# Patient Record
Sex: Male | Born: 1965 | Race: White | Hispanic: No | Marital: Single | Smoking: Never smoker
Health system: Southern US, Community
[De-identification: ages and names within clinical notes are randomized; demographics above are authoritative.]

## PROBLEM LIST (undated history)

## (undated) DIAGNOSIS — I10 Essential (primary) hypertension: Secondary | ICD-10-CM

## (undated) DIAGNOSIS — N400 Enlarged prostate without lower urinary tract symptoms: Secondary | ICD-10-CM

## (undated) DIAGNOSIS — N200 Calculus of kidney: Secondary | ICD-10-CM

## (undated) DIAGNOSIS — F329 Major depressive disorder, single episode, unspecified: Secondary | ICD-10-CM

## (undated) DIAGNOSIS — F32A Depression, unspecified: Secondary | ICD-10-CM

## (undated) DIAGNOSIS — I219 Acute myocardial infarction, unspecified: Secondary | ICD-10-CM

## (undated) HISTORY — DX: Benign prostatic hyperplasia without lower urinary tract symptoms: N40.0

## (undated) HISTORY — PX: OTHER SURGICAL HISTORY: SHX169

## (undated) HISTORY — PX: CHOLECYSTECTOMY: SHX55

## (undated) HISTORY — PX: HAND SURGERY: SHX662

## (undated) HISTORY — DX: Acute myocardial infarction, unspecified: I21.9

## (undated) HISTORY — DX: Calculus of kidney: N20.0

## (undated) HISTORY — PX: FRACTURE SURGERY: SHX138

## (undated) HISTORY — PX: KIDNEY SURGERY: SHX687

---

## 1996-02-09 HISTORY — PX: KIDNEY SURGERY: SHX687

## 1997-09-15 ENCOUNTER — Inpatient Hospital Stay (HOSPITAL_COMMUNITY): Admission: EM | Admit: 1997-09-15 | Discharge: 1997-09-16 | Payer: Self-pay | Admitting: Emergency Medicine

## 1997-09-26 ENCOUNTER — Emergency Department (HOSPITAL_COMMUNITY): Admission: EM | Admit: 1997-09-26 | Discharge: 1997-09-26 | Payer: Self-pay | Admitting: Emergency Medicine

## 1999-05-14 ENCOUNTER — Emergency Department (HOSPITAL_COMMUNITY): Admission: EM | Admit: 1999-05-14 | Discharge: 1999-05-14 | Payer: Self-pay | Admitting: Emergency Medicine

## 2001-06-28 ENCOUNTER — Emergency Department (HOSPITAL_COMMUNITY): Admission: EM | Admit: 2001-06-28 | Discharge: 2001-06-28 | Payer: Self-pay | Admitting: *Deleted

## 2001-06-28 ENCOUNTER — Encounter: Payer: Self-pay | Admitting: *Deleted

## 2001-07-16 ENCOUNTER — Encounter: Payer: Self-pay | Admitting: Emergency Medicine

## 2001-07-16 ENCOUNTER — Emergency Department (HOSPITAL_COMMUNITY): Admission: EM | Admit: 2001-07-16 | Discharge: 2001-07-16 | Payer: Self-pay | Admitting: Emergency Medicine

## 2001-08-30 ENCOUNTER — Emergency Department (HOSPITAL_COMMUNITY): Admission: EM | Admit: 2001-08-30 | Discharge: 2001-08-30 | Payer: Self-pay | Admitting: Emergency Medicine

## 2001-09-11 ENCOUNTER — Emergency Department (HOSPITAL_COMMUNITY): Admission: EM | Admit: 2001-09-11 | Discharge: 2001-09-11 | Payer: Self-pay | Admitting: Emergency Medicine

## 2001-09-11 ENCOUNTER — Encounter: Payer: Self-pay | Admitting: Emergency Medicine

## 2001-09-14 ENCOUNTER — Encounter: Payer: Self-pay | Admitting: Emergency Medicine

## 2001-09-14 ENCOUNTER — Emergency Department (HOSPITAL_COMMUNITY): Admission: EM | Admit: 2001-09-14 | Discharge: 2001-09-14 | Payer: Self-pay

## 2001-09-18 ENCOUNTER — Emergency Department (HOSPITAL_COMMUNITY): Admission: EM | Admit: 2001-09-18 | Discharge: 2001-09-18 | Payer: Self-pay

## 2002-04-19 ENCOUNTER — Emergency Department (HOSPITAL_COMMUNITY): Admission: EM | Admit: 2002-04-19 | Discharge: 2002-04-19 | Payer: Self-pay | Admitting: Emergency Medicine

## 2002-04-19 ENCOUNTER — Encounter: Payer: Self-pay | Admitting: Emergency Medicine

## 2002-04-21 ENCOUNTER — Encounter: Payer: Self-pay | Admitting: Emergency Medicine

## 2002-04-21 ENCOUNTER — Inpatient Hospital Stay (HOSPITAL_COMMUNITY): Admission: EM | Admit: 2002-04-21 | Discharge: 2002-04-24 | Payer: Self-pay | Admitting: Emergency Medicine

## 2002-04-22 ENCOUNTER — Encounter: Payer: Self-pay | Admitting: Urology

## 2002-04-23 ENCOUNTER — Ambulatory Visit (HOSPITAL_BASED_OUTPATIENT_CLINIC_OR_DEPARTMENT_OTHER): Admission: RE | Admit: 2002-04-23 | Discharge: 2002-04-23 | Payer: Self-pay | Admitting: Urology

## 2002-04-23 ENCOUNTER — Encounter: Payer: Self-pay | Admitting: Urology

## 2002-04-24 ENCOUNTER — Encounter: Payer: Self-pay | Admitting: Urology

## 2002-05-02 ENCOUNTER — Ambulatory Visit (HOSPITAL_COMMUNITY): Admission: RE | Admit: 2002-05-02 | Discharge: 2002-05-02 | Payer: Self-pay | Admitting: Urology

## 2003-01-04 ENCOUNTER — Emergency Department (HOSPITAL_COMMUNITY): Admission: EM | Admit: 2003-01-04 | Discharge: 2003-01-04 | Payer: Self-pay | Admitting: Emergency Medicine

## 2003-02-19 ENCOUNTER — Inpatient Hospital Stay (HOSPITAL_COMMUNITY): Admission: EM | Admit: 2003-02-19 | Discharge: 2003-02-25 | Payer: Self-pay | Admitting: Emergency Medicine

## 2003-11-11 ENCOUNTER — Emergency Department (HOSPITAL_COMMUNITY): Admission: EM | Admit: 2003-11-11 | Discharge: 2003-11-11 | Payer: Self-pay | Admitting: Emergency Medicine

## 2004-03-17 ENCOUNTER — Emergency Department (HOSPITAL_COMMUNITY): Admission: EM | Admit: 2004-03-17 | Discharge: 2004-03-17 | Payer: Self-pay | Admitting: *Deleted

## 2004-03-17 ENCOUNTER — Emergency Department (HOSPITAL_COMMUNITY): Admission: EM | Admit: 2004-03-17 | Discharge: 2004-03-17 | Payer: Self-pay | Admitting: Emergency Medicine

## 2006-03-16 ENCOUNTER — Encounter: Admission: RE | Admit: 2006-03-16 | Discharge: 2006-03-16 | Payer: Self-pay | Admitting: Orthopedic Surgery

## 2006-06-18 ENCOUNTER — Emergency Department (HOSPITAL_COMMUNITY): Admission: EM | Admit: 2006-06-18 | Discharge: 2006-06-18 | Payer: Self-pay | Admitting: Emergency Medicine

## 2007-05-26 ENCOUNTER — Emergency Department (HOSPITAL_COMMUNITY): Admission: EM | Admit: 2007-05-26 | Discharge: 2007-05-26 | Payer: Self-pay | Admitting: Emergency Medicine

## 2010-06-26 NOTE — Discharge Summary (Signed)
Leon Pena, Leon Pena                          ACCOUNT NO.:  [id number]   MEDICAL RECORD NO.:  [id number]                   PATIENT TYPE:  INP   LOCATION:  5707                                 FACILITY:  [facility]   PHYSICIAN:  Corinna L. Lendell Caprice, MD             DATE OF BIRTH:  [birth year]   DATE OF ADMISSION:  02/19/2003  DATE OF DISCHARGE:  02/25/2003                                 DISCHARGE SUMMARY   DIAGNOSES:  1. Pyelonephritis.  2. Dehydration.  3. Intractable nausea and vomiting.  4. History of right nephrectomy.  5. History of closed head injury.  6. Tobacco abuse.   DISCHARGE MEDICATIONS:  1. Ampicillin 500 mg p.o. q.6h. for seven more days.  2. Compazine 10 mg p.o. as needed for nausea.  3. Percocet 1-2 p.o. q.4h. p.r.n. pain, 10 more dispensed.   Follow up with [facility] as needed.   ACTIVITY:  Ad lib.   HISTORY/HOSPITAL COURSE:  Mr. Sedivy is a 45 year old white male who  presented to the emergency room with vomiting and flank pain for about a  week.  He has a history of pyelonephritis in the past and has had a right  nephrectomy for unknown reason.  He was also having fever and diarrhea.  He  was unable to keep anything down.  He was found to have a urinary tract  infection and left CVA tenderness.   His urine culture grew out Enterococcus, which was sensitive to ampicillin,  levofloxacin, nitrofurantoin, and vancomycin.  His UA showed moderate  leukocyte esterase, negative nitrites, many bacteria, 11-20 white cells.  LFTs were normal.  Basic metabolic panel normal.  Initial white blood cell  count was 11,000.   CAT scan of the abdomen and pelvis without IV or oral contrast showed no  evidence of ureteral calculi and was otherwise stable.   Patient was admitted to the floor, given IV fluids, IV antiemetics, and IV  antibiotics.  He continued to have pain and intractable vomiting for several  days but eventually was able to tolerate a regular diet.  As the  Enterococcus was sensitive to ampicillin, he was switched over and is being  discharged on ampicillin to complete a total of two weeks.  He has no primary care physician, and it sounds as though he was having  urinary frequency and other symptoms for about a week.  So I have  recommended that he establish care with a primary care physician of his  choice or [facility] so that hospitalization can be prevented,  hopefully, if this recurs.                                                Corinna L. Lendell Caprice, MD    CLS/MEDQ  D:  02/25/2003  T:  02/26/2003  Job:  [id number]

## 2010-06-26 NOTE — Op Note (Signed)
Leon Pena, Leon Pena                          ACCOUNT NO.:  [id number]   MEDICAL RECORD NO.:  [id number]                   PATIENT TYPE:  INP   LOCATION:  0340                                 FACILITY:  [facility]   PHYSICIAN:  Valetta Fuller, M.D.               DATE OF BIRTH:  09/11/1965   DATE OF PROCEDURE:  04/23/2002  DATE OF DISCHARGE:  04/24/2002                                 OPERATIVE REPORT   PREOPERATIVE DIAGNOSES:  1. Solitary left kidney.  2. Left flank pain.  3. Multiple left renal calculi.  4. 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  5. Left pyelonephritis.   POSTOPERATIVE DIAGNOSES:  1. Solitary left kidney.  2. Left flank pain.  3. Multiple left renal calculi.  4. 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  5. Left pyelonephritis.   PROCEDURES:  1. Cystoscopy.  2. Retrograde pyelogram.  3. Stent placement.   SURGEON:  Valetta Fuller, M.D.   ANESTHESIA:  General.   INDICATIONS:  The patient is a 45 year old male.  He essentially has a  solitary kidney.  His right kidney was removed for benign disease, although  he really is unable to give me a whole lot of details.  It appears he had  some long-standing stones and infection and I suspect he had a  nonfunctioning kidney, which was subsequently removed.  I saw him about  eight to nine months ago with some nonspecific flank pain.  At that time a  CT showed several small stones in the lower pole of his left kidney and a  dilated left renal pelvis but no obvious ureteral stones.  We recommended  cystoscopy with retrograde pyelogram and possible stent placement and he  agreed to that but then cancelled his surgery and never came back.  He  recently began having some intermittent left flank pain and was evaluated.  His CT was essentially the same in that it showed a dilated left renal  pelvis without any obvious stones in the ureter.  The stones in the lower  pole of his kidney had  increased in size and were now 10 mm, with two stones  in that location and a much smaller stone up higher in the kidney.  The  patient continued to have flank discomfort.  We had set him up for an  outpatient evaluation with a retrograde pyelogram.  He subsequently had to  be admitted with some fever and increasing flank pain, and Sigmund I.  Patsi Sears, M.D., admitted him with probable mild pyelonephritis.  His  clinical situation improved.  A renal scan showed some delayed wash-out from  that kidney, but it was equivocal and no evidence of high-grade obstruction  was appreciated.  He did have a positive urine culture but again had  clinically improved.  His serum renal function studies were normal.  The  patient is now having further evaluation to rule out a mild  UPJ obstruction  and to be absolutely certain there is no ureteral pathology.   TECHNIQUE AND FINDINGS:  The patient was brought to the operating room,  where he had successful induction of general anesthesia.  He was prepped and  draped in the usual manner.  Cystoscopy revealed an unremarkable prostate  and bladder.  Retrograde pyelogram on the left showed a normal ureter.  At  the ureteropelvic junction there was a slight area of narrowing, and one  could appreciate a jet of contrast.  I did not think the obstruction was  real high-grade, but his renal pelvis and calyceal system was certainly  dilated.  We decided to go ahead and place a guidewire, which was done  without difficulty.  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  Because we may perform  lithotripsy, I decided to go ahead and place a double J stent.  For that  reason a 7 [place], 24 cm stent was placed over the guidewire with  fluoroscopic as well as visual guidance.  Position was confirmed to be good.  The patient had no obvious  complications or problems and was brought to the  recovery room in stable condition.                                               Valetta Fuller, M.D.    DSG/MEDQ  D:  04/25/2002  T:  04/26/2002  Job:  [id number]

## 2010-06-26 NOTE — Discharge Summary (Signed)
[name], Leon Pena                          ACCOUNT NO.:  [id number]   MEDICAL RECORD NO.:  [id number]                   PATIENT TYPE:  INP   LOCATION:  0340                                 FACILITY:  [facility]   PHYSICIAN:  Valetta Fuller, M.D.               DATE OF BIRTH:  01-13-1966   DATE OF ADMISSION:  04/21/2002  DATE OF DISCHARGE:  04/24/2002                                 DISCHARGE SUMMARY   DISCHARGE DIAGNOSES:  1. Left flank pain.  2. Pyelonephritis.  3. Hydronephrosis.  4. Calculus of the kidney.  5. Acquired absence of the kidney.   PROCEDURES PERFORMED:  On 04/23/2002 cystoscopy, retrograde pyelogram, and  stent placement.   HOSPITAL COURSE:  The patient is a 45 year old male.  He has a somewhat  complex urologic history.  His right kidney was removed apparently due to  stone disease with nonfunction.  We do not have any of those records and  that occurred in [place].  I saw him approximately 9 months prior to this  admission with some nonspecific left flank pain.  At that time a CT showed  several stones in the lower pole of the left kidney with a slightly dilated  left renal pelvis but no obvious ureteral stones.  We recommended additional  workup including cystoscopy, retrograde pyelogram, and possible stent  placement but he canceled the surgery and never came back.  He came back to  my office more recently complaining again of some intermittent left flank  pain.  A CT showed a dilated left renal pelvis without any ureteral calculi.  He had multiple stones in the lower pole of his left kidney which have now  increased in size and were about 2-10 mm in size.  We felt that the patient  probably had an underlying intermittent left UPJ obstruction.  We were  planning on doing an outpatient workup but he had to be admitted by Dr.  Patsi Sears on 04/21/2002 because of increased pain along with fever and  chills.  A urine culture was positive for Enterococcus.  His  clinical  situation improved with IV hydration, antibiotics, and pain medication.  On  04/23/2002 I took him to surgery.  Retrograde pyelogram showed no filling  defects or obstruction in the ureter.  The patient did have what appeared to  be a partial left UPJ obstruction.  The renal pelvis and calyceal system was  slightly dilated.  The calyx that contained the stones in the lower pole  never filled suggesting the possibility of some infundibular stenosis.  We  elected to place a 24 cm 7-French double-J stent.  The next morning the  patient was still having some flank pain but was afebrile with normal vital  signs.  We elected to discharge him at that time and he will have additional  workup.  Of note, the patient did have a renal scan which showed  some  delayed excretion but pretty good washout with Lasix again suggesting a mild  UPJ and that was done prior to his stent placement.   DISPOSITION:  The patient is discharged home in stable condition.  He was  sent home with some Uracid, Vicodin, and amoxicillin and will follow up in  our office in a few days.                                               Valetta Fuller, M.D.    DSG/MEDQ  D:  06/06/2002  T:  06/06/2002  Job:  [id number]

## 2010-06-26 NOTE — H&P (Signed)
NAMEKENNIS, WISSMANN                          ACCOUNT NO.:  [id number]   MEDICAL RECORD NO.:  [id number]                   PATIENT TYPE:  INP   LOCATION:  5707                                 FACILITY:  [facility]   PHYSICIAN:  Melissa L. Ladona Ridgel, MD               DATE OF BIRTH:  18-May-1965   DATE OF ADMISSION:  02/19/2003  DATE OF DISCHARGE:                                HISTORY & PHYSICAL   ADDENDUM:  The patient's allergies are to [facility] which cause muscle contractions and  he is currently not taking any medications.                                                Melissa L. Ladona Ridgel, MD    MLT/MEDQ  D:  02/19/2003  T:  02/19/2003  Job:  [id number]

## 2010-06-26 NOTE — Op Note (Signed)
Leon Pena, Leon Pena                          ACCOUNT NO.:  [id number]   MEDICAL RECORD NO.:  [id number]                   PATIENT TYPE:  AMB   LOCATION:  DAY                                  FACILITY:  [facility]   PHYSICIAN:  Valetta Fuller, M.D.               DATE OF BIRTH:  Mar 30, 1965   DATE OF PROCEDURE:  05/02/2002  DATE OF DISCHARGE:                                 OPERATIVE REPORT   PREOPERATIVE DIAGNOSES:  1. Multiple left renal calculi.  2. Solitary kidney.  3. Left flank pain.  4. Left partial UPJ obstruction.   POSTOPERATIVE DIAGNOSES:  1. Multiple left renal calculi.  2. Solitary kidney.  3. Left flank pain.  4. Left partial UPJ obstruction.   PROCEDURE:  Cystoscopy, stent removal, flexible ureteroscopy, retrograde  pyelography.   SURGEON:  Valetta Fuller, M.D.   ANESTHESIA:  General.   INDICATIONS FOR PROCEDURE:  Leon Pena is a 45 year old male. He has a  complex urologic history. He had his right kidney removed in [place]  presumably due to stone disease and nonfunction. He tells me during the  course of that he had had several lithotripsies and apparently his stones  did not fracture well with the lithotripsy. I saw him about nine months ago  with some flank pain. At that time, a CT scan had shown some dilation of the  left renal pelvis but no stones in the ureter. He had a couple of small  stones in the lower pole of his left kidney. It was difficult to ascertain  was what going on at that time and we recommended evaluation with retrograde  pyelograms. The patient canceled his surgery at that time and we never heard  from him again until recently. He presented with increasing intermittent  flank discomfort. Repeat CT showed two stones in the lower pole of his  kidney which had increased in size. The left renal pelvis was again somewhat  dilated but there were no stones in the ureter and the ureter itself did not  look dilated. The question was whether he  had a UPJ obstruction. I planned  on evaluating things with retrograde pyelography. In the interim, his flank  pain intensified and he was admitted by one of my partners. At that time, he  was noted to have a febrile urinary tract infection and thought to have mild  pyelonephritis. That resolved with treatment. A nuclear medicine renal scan  showed some delayed excretion of radionuclear type on the left kidney but it  did wash out reasonably well with Lasix and the T 1/2 life was equivocal. I  performed a retrograde pyelogram which showed no stones or filling defects  in the ureter and evidence of a ureteropelvic junction obstruction. He had  two stones in the lower pole of his kidney. The pelvis itself was dilated  but the calices were not terribly blown out. The  patient presents now for  attempted treatment of his lower pole calculi. These have increased two fold  in size over the last six months. The patient does have a solitary kidney  and we felt that treatment was indicated. We had considered lithotripsy but  given his apparent history of failure of lithotripsy, we thought it prudent  to consider a ureteroscopic approach. We knew that it would be difficult  given his lower pole situation but we thought we had to assess him and see  if he had evidence of infundibular stenosis or try to further assess his  anatomy.   TECHNIQUE:  The patient was brought to the operating room where he had  successful induction of general anesthesia. He was placed in lithotomy  position, prepped and draped in the usual manner. Cystoscopy was  unremarkable aside from the stent in good position. The stent was partially  removed and a guidewire was run up the stent under fluoroscopic guidance to  the renal pelvis. An access sheath was then placed. Through the access  sheath, we had no difficulty getting a flexible ureteroscope up into the  renal pelvis. The renal pelvis itself was fairly dilated without  pathology.  Careful inspection of the ureteropelvic junction showed a slight arrow of  narrowing but it had no difficulty whatsoever passing the scope. All of his  infundibulum appeared somewhat stenotic. We were able to identify the  opening to one of the lower poles which contained the stone. It appeared  that infundibulum was markedly stenotic. I was able to get a guidewire  through there and it appeared that the guidewire did come up against the  stones indicating that we were probably in the right calix but we were  unable to advance the scope and I don't think the scope would have went  through this area anyway. After much trial, we decided that there was no way  we were going to access these stones. Certainly given the angles involved,  there was no way really to dilate the infundibulum and to be able to get in  there through a retrograde approach in my opinion. I performed a retrograde  pyelogram and saw once again that the entire pelvis filled as well as the  caliceal system except the calix containing those two stones indicating  again fairly significant infundibular stenosis. My plan at this point was to  remove his double J stent. Obviously these stones have no chance of  migrating into the ureter but certainly seem to be causing some discomfort  for the patient. The pain may actually be more due to the infundibular  stenosis than really the stones and I think lithotripsy would be a futile  event. I think lithotripsy would simply potentially fracture the stones but  would do nothing for the infundibular stenosis and he would not pass these  pieces. If he does well clinically and has no pain, I think he could be  observed. If he continues to have flank pain, he will need to be sent to an  academic center for consideration of a percutaneous approach to try to  remove these stones.                                               Valetta Fuller, M.D.    DSG/MEDQ  D:   05/02/2002  T:  05/02/2002  Job:  [id number]

## 2010-06-26 NOTE — H&P (Signed)
Leon Pena, Leon Pena                          ACCOUNT NO.:  [id number]   MEDICAL RECORD NO.:  [id number]                   PATIENT TYPE:  INP   LOCATION:  1845                                 FACILITY:  [facility]   PHYSICIAN:  Melissa L. Ladona Ridgel, MD               DATE OF BIRTH:  20-Sep-1965   DATE OF ADMISSION:  02/19/2003  DATE OF DISCHARGE:                                HISTORY & PHYSICAL   PRIMARY CARE PHYSICIAN:  None, however, the patient has seen Dr. Isabel Caprice with  [facility] approximately two years ago.   CHIEF COMPLAINT:  Nausea, vomiting, and flank pain times 4+ days.   HISTORY OF PRESENT ILLNESS:  The patient is a 45 year old white male who was  born with a renal problem of unknown specifications.  He states that in 1998  secondary to complications from the renal issue, he had to have a  nephrectomy.  He stated there was some issue with regard to adhesion and  obstruction.  One week prior to admission, the patient states that he  developed flu-like symptoms consisting of fever and vomiting.  He felt that  the symptoms were getting better; however, with the last four days, he has  had increased nausea, vomiting, and developed left flank pain.  He also  noted decreased urine output over this last couple of days.  He has had no  sick contacts but has had diarrhea and has been unable to eat or keep any  liquids down.  Patient states that he is aware that he has several renal  stones that have been unable to be removed secondary to the fact that they  would need to have surgical intervention and in light of the fact that he  has only one kidney, the surgeon has been hesitant to do so.  He states that  about 6 or 7 years ago, he had a fairly significant kidney infection;  however, he has not had any recent hospitalizations.   PAST MEDICAL HISTORY:  Significant for kidney stones and kidney infections 6-  7 years ago, treated with Cipro.   PAST SURGICAL HISTORY:  1. Right  nephrectomy.  2. Trauma with head injury in 1994 requiring placing screws to his head.   SOCIAL HISTORY:  He occasionally smokes tobacco.  He definitively chews on a  daily basis.  He does drink socially and works as a Medical sales representative.   FAMILY HISTORY:  Significant for his mother, who is living with  hypertension.  Father is deceased secondary to diabetes and parkinsonism.  His brother also has a kidney problem, which he is not aware of the  diagnosis.   REVIEW OF SYSTEMS:  Significant for dizziness, loss of balance, not  sleeping, no musculoskeletal complaints but again nausea, vomiting,  diarrhea, and left flank pain as per HPI.  All other review of systems are  negative.   His laboratory values in the  emergency room reveal a sodium of 139,  potassium 3.9, chloride 108, CO2 26, BUN 18, creatinine 1.1.  His glucose is  87.  I-STAT review of his H&H reveals hemoglobin of 17, hematocrit 50.  His  CBC is pending.  His pH is 7.41 with a pCO2 of 38 and a bicarb of 24.5.  His  UA is significant for a pH of 1.020, specific gravity of 6, nitrite  negative, leukocyte esterase negative, but he does have 11-20 white cells  and 11-20 red cells.   PHYSICAL EXAMINATION:  VITAL SIGNS:  His vital signs on admission show a  temperature of 98.1, blood pressure 102/57, pulse 62, respiratory rate 22,  sats 98% on room air.  GENERAL:  He is in mild distress secondary to left flank pain.  HEENT:  Normocephalic with a well-healed craniotomy scar extending from the  left ear across the top of his head.  His pupils are equal, round and  reactive to light.  Extraocular muscles are intact.  His mucous membranes  are moist.  He has no oral lesions.  NECK:  Supple.  There is no JVD.  There are no lymph nodes.  No bruits.  He  has no thyromegaly.  CHEST:  Clear to auscultation.  CARDIOVASCULAR:  Regular rate and rhythm.  There is a positive S1 and S2.  No S3 or S4.  No murmurs, rubs or gallops.  ABDOMEN:   Soft.  He has tenderness in the left back and flank area extending  into the left groin and left lower quadrant.  He has no distention.  Positive bowel sounds.  No hepatosplenomegaly.  He has bilateral well-healed  flank scars from his surgeries.  EXTREMITIES:  Pulses 2+.  No edema.  NEUROLOGIC:  His power is 5/5.  Reflexes are 2+.  He is nonfocal.   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  He  presents with one week of nausea, vomiting, fever, and now with worsening  symptoms and left flank pain with decreased urine output.  Assessment #1:  Urinary tract infection/pyelonephritis:  CT scan does show  old stones which are stable.  There is no hydronephrosis or ureteral  obstruction at this time.  He was given Tequin in the emergency room with  pain medication in the IV fluids.  We will continue his IV antibiotics with  Cipro in the morning 400 b.i.d. unless redosing is necessary because of his  single kidney.  Will check a UAC on this and IV hydrate him with D5 normal  saline at 125 cc/hrs.  He will be offered clear liquids and advance his diet  as tolerated.  We will consider contacting [facility] if the patient's  creatinine is elevated or he develops worsening symptoms.  Assessment #2:  Pain management will be Dilaudid p.r.n.  We will check his  LFTs and a full CBC to examine his white count.  If his temperatures are  greater than or equal to 100.5, then we will order blood cultures x2.  The  patient is a full code.                                                Melissa L. Ladona Ridgel, MD    MLT/MEDQ  D:  02/19/2003  T:  02/19/2003  Job:  [id number]

## 2010-11-03 LAB — COMPREHENSIVE METABOLIC PANEL
ALT: 26
AST: 32
Albumin: 4.5
Alkaline Phosphatase: 68
CO2: 28
Chloride: 104
GFR calc Af Amer: >60
GFR calc non Af Amer: >60
Potassium: 4.5
Sodium: 138
Total Bilirubin: 1.2

## 2010-11-03 LAB — CBC
HCT: 47
Hemoglobin: 16.5
MCHC: 35.2
MCV: 88.5
Platelets: 288
RBC: 5.31
RDW: 12.6
WBC: 9

## 2010-11-03 LAB — DIFFERENTIAL
Basophils Absolute: 0
Basophils Relative: 0
Eosinophils Absolute: 0.1
Eosinophils Relative: 1
Lymphocytes Relative: 14
Monocytes Absolute: 0.4

## 2010-11-03 LAB — RAPID URINE DRUG SCREEN, HOSP PERFORMED
Amphetamines: NOT DETECTED
Barbiturates: NOT DETECTED
Benzodiazepines: NOT DETECTED
Cocaine: NOT DETECTED
Opiates: NOT DETECTED
Tetrahydrocannabinol: NOT DETECTED

## 2010-11-03 LAB — ETHANOL: Alcohol, Ethyl (B): <5

## 2010-11-03 LAB — ACETAMINOPHEN LEVEL: Acetaminophen (Tylenol), Serum: <10 — ABNORMAL LOW

## 2011-04-26 ENCOUNTER — Emergency Department (HOSPITAL_COMMUNITY): Payer: Self-pay

## 2011-04-26 ENCOUNTER — Emergency Department (HOSPITAL_COMMUNITY)
Admission: EM | Admit: 2011-04-26 | Discharge: 2011-04-26 | Disposition: A | Discharge status: Home / Self Care | Payer: Self-pay | Attending: Emergency Medicine | Admitting: Emergency Medicine

## 2011-04-26 ENCOUNTER — Encounter (HOSPITAL_COMMUNITY): Payer: Self-pay | Admitting: *Deleted

## 2011-04-26 DIAGNOSIS — I10 Essential (primary) hypertension: Secondary | ICD-10-CM | POA: Insufficient documentation

## 2011-04-26 DIAGNOSIS — M7042 Prepatellar bursitis, left knee: Secondary | ICD-10-CM

## 2011-04-26 DIAGNOSIS — M25569 Pain in unspecified knee: Secondary | ICD-10-CM | POA: Insufficient documentation

## 2011-04-26 HISTORY — DX: Essential (primary) hypertension: I10

## 2011-04-26 MED ORDER — OXYCODONE-ACETAMINOPHEN 5-325 MG PO TABS
1.0000 | ORAL_TABLET | Freq: Once | ORAL | Status: AC
  Administered 2011-04-26: 1 via ORAL
  Filled 2011-04-26: qty 1

## 2011-04-26 MED ORDER — CEPHALEXIN 500 MG PO CAPS: 500.0000 mg | ORAL_CAPSULE | Freq: Four times a day (QID) | ORAL | Status: AC

## 2011-04-26 MED ORDER — HYDROCODONE-ACETAMINOPHEN 5-500 MG PO TABS: 1.0000 | ORAL_TABLET | Freq: Four times a day (QID) | ORAL | Status: AC | PRN

## 2011-04-26 MED ORDER — IBUPROFEN 800 MG PO TABS: 800.0000 mg | ORAL_TABLET | Freq: Three times a day (TID) | ORAL | Status: AC

## 2011-04-26 NOTE — Progress Notes (Signed)
ED CM contacted by [facility] ED RN to inquire about medication assistance for Keflex & vicodin for self pay pt.  [facility] indigent program with not assist with Vicodin and Keflex is listed on [employer] $4 list. Pt chose to purchase Keflex at [facility] versus using [facility] indigent program at this time. CM and [facility] coordinator spoke with him Pt offered [facility] services to assist with finding a [place] self pay provider Pt accepted information

## 2011-04-26 NOTE — ED Provider Notes (Signed)
History     CSN: [id number]  Arrival date & time 04/26/11  1219   First MD Initiated Contact with Patient 04/26/11 1258      Chief Complaint  Patient presents with  . Knee Pain    (Consider location/radiation/quality/duration/timing/severity/associated sxs/prior treatment) HPI  46 year old male presents with chief complaints of left knee pain. Patient states he noticed pain and swelling to the anterior aspect of his left knee. The pain is described as a sharp sensation that radiates down to his foot. Pain is constant and worsened with walking.  He denies prior sxs similar to this.  He also notice a few blisters to his L foot at the same time that he noted the knee pain.  He denies any recent trauma. He denies fever. He denies hip pain or ankle pain. He denies any recent strenuous exercise or any activities that seems to aggravate his symptoms he denies any change in his shoes. Patient does not have a history of gout. Is currently unemployed and denies any heavy work.   Past Medical History  Diagnosis Date  . Hypertension     Past Surgical History  Procedure Date  . Kidney surgery   . Cholecystectomy     No family history on file.  History  Substance Use Topics  . Smoking status: Never Smoker   . Smokeless tobacco: Not on file  . Alcohol Use: No      Review of Systems  All other systems reviewed and are negative.    Allergies  Review of patient's allergies indicates not on file.  Home Medications  No current outpatient prescriptions on file.  BP 146/86  Pulse 85  Temp 98.2 F (36.8 C)  Resp 20  Ht 6\' 3"  (1.905 m)  Wt 270 lb (122.471 kg)  BMI 33.75 kg/m2  SpO2 100%  Physical Exam  Nursing note and vitals reviewed. Constitutional: He appears well-nourished.  HENT:  Head: Atraumatic.  Eyes: Conjunctivae are normal.  Neck: Neck supple.  Pulmonary/Chest: Effort normal. No respiratory distress. He exhibits no tenderness.  Musculoskeletal:       L knee:  An area of swelling and tenderness noted to the anterior inferior aspects of patella region of the left knee. Mild fluctuants noted.  Mild redness noted. Increasing pain with palpation and with knee range of motion.  Left foot: several blisters that has desiccated were noted to mid palm of foot.  Mildly tender on palpation.   Neurological: He is alert.  Skin: Skin is warm.  Psychiatric: He has a normal mood and affect.    ED Course  Procedures (including critical care time)  Labs Reviewed - No data to display No results found.   No diagnosis found.    MDM  L knee pain to the pretibial/prepatellar bursa region.  Discussed with my attending and we have low suspicion for infected joint.  Will treat pain with NSaids, pain meds, and Keflex as there are mild erythema to the skin.  Pt has prior hx of bursitis to L elbow.          Fayrene Helper, PA-C 04/26/11 1501

## 2011-04-26 NOTE — ED Notes (Signed)
Patient transported to X-ray 

## 2011-04-26 NOTE — ED Notes (Signed)
Pt state about 2-3 days ago he noticed a knot forming on his left knee. Pt state it painful to ambulate.pt also states he has shooting pain down his left knee. Pt denies any injury to left knee

## 2011-04-26 NOTE — Discharge Instructions (Signed)
Prepatellar Bursitis with Rehab  Bursitis is a condition that is characterized by inflammation of a bursa. Bursa exists in many areas of the body. They are fluid filled sacs that lie between a soft tissue (skin, tendon, or ligament) and a bone, and they reduce friction between the structures as well as the stress placed on the soft tissue. Prepatellar bursitis is inflammation of the bursa that lies between the skin and the kneecap (patella). This condition often causes pain over the patella. SYMPTOMS   Pain, tenderness, and/or inflammation over the patella.   Pain that worsens with movement of the knee joint.   Decreased range of motion for the knee joint.   A crackling sound (crepitation) when the bursa is moved or touched.   Occasionally, painless swelling of the bursa.   Fever (when infected).  CAUSES  Bursitis is caused by damage to the bursa, which results in an inflammatory response. Common mechanisms of injury include:  Direct trauma to the front of the knee.   Repetitive and/ or stressful use of the knee.  RISK INCREASES WITH:  Activities in which kneeling and/or falling on one's knees is likely (volleyball or football).   Repetitive and stressful training, especially if it involves running on hills.   Improper training techniques, such as a sudden increase in the intensity, frequency or duration of training.   Failure to warm-up properly before activity.   Poor technique.   Artificial turf.  PREVENTION   Avoid kneeling or falling on your knees.   Warm up and stretch properly before activity.   Allow for adequate recovery between workouts.   Maintain physical fitness:   Strength, flexibility, and endurance.   Cardiovascular fitness.   Learn and use proper technique. When possible, a have coach correct improper technique.   Wear properly fitted and padded protective equipment (knee pads).  PROGNOSIS  If treated properly, then the symptoms of prepatellar  bursitis usually resolve within 2 weeks. RELATED COMPLICATIONS   Recurrent symptoms that result in a chronic problem.   Prolonged healing time, if improperly treated or re-injured.   Limited range of motion.   Infection of bursa.   Chronic inflammation or scarring of bursa.  TREATMENT  Treatment initially involves the use of ice and medication to help reduce pain and inflammation. The use of strengthening and stretching exercises may help reduce pain with activity, especially those of the quadriceps and hamstring muscles. These exercises may be performed at home or with referral to a therapist. Your caregiver may recommend kneepads when you return to playing sports, in order to reduce the stress on the prepatellar bursa. If symptoms persist despite treatment, then your caregiver may drain fluid out with a needle (aspirate) the bursa. If symptoms persist for greater than 6 months despite non-surgical (conservative) treatment, then surgery may be recommended to remove the bursa.  MEDICATION  If pain medication is necessary, then nonsteroidal anti-inflammatory medications, such as aspirin and ibuprofen, or other minor pain relievers, such as acetaminophen, are often recommended.   Do not take pain medication for 7 days before surgery.   Prescription pain relievers may be given if deemed necessary by your caregiver. Use only as directed and only as much as you need.   Corticosteroid injections may be given by your caregiver. These injections should be reserved for the most serious cases, because they may only be given a certain number of times.  HEAT AND COLD  Cold treatment (icing) relieves pain and reduces inflammation. Cold treatment should   be applied for 10 to 15 minutes every 2 to 3 hours for inflammation and pain and immediately after any activity that aggravates your symptoms. Use ice packs or massage the area with a piece of ice (ice massage).   Heat treatment may be used prior to  performing the stretching and strengthening activities prescribed by your caregiver, physical therapist, or athletic trainer. Use a heat pack or soak the injury in warm water.  SEEK MEDICAL CARE IF:  Treatment seems to offer no benefit, or the condition worsens.   Any medications produce adverse side effects.  EXERCISES RANGE OF MOTION (ROM) AND STRETCHING EXERCISES - Prepatellar Bursitis These exercises may help you when beginning to rehabilitate your injury. Your symptoms may resolve with or without further involvement from your physician, physical therapist or athletic trainer. While completing these exercises, remember:   Restoring tissue flexibility helps normal motion to return to the joints. This allows healthier, less painful movement and activity.   An effective stretch should be held for at least 30 seconds.   A stretch should never be painful. You should only feel a gentle lengthening or release in the stretched tissue.  STRETCH - Hamstrings, Standing  Stand or sit and extend your right / left leg, placing your foot on a chair or foot stool   Keeping a slight arch in your low back and your hips straight forward.   Lead with your chest and lean forward at the waist until you feel a gentle stretch in the back of your right / left knee or thigh. (When done correctly, this exercise requires leaning only a small distance.)   Hold this position for __________ seconds.  Repeat __________ times. Complete this stretch __________ times per day. STRETCH - Quadriceps, Prone   Lie on your stomach on a firm surface, such as a bed or padded floor.   Bend your right / left knee and grasp your ankle. If you are unable to reach, your ankle or pant leg, use a belt around your foot to lengthen your reach.   Gently pull your heel toward your buttocks. Your knee should not slide out to the side. You should feel a stretch in the front of your thigh and/or knee.   Hold this position for  __________ seconds.  Repeat __________ times. Complete this stretch __________ times per day.  STRETCH - Hamstrings/Adductors, V-Sit   Sit on the floor with your legs extended in a large "V," keeping your knees straight.   With your head and chest upright, bend at your waist reaching for your right foot to stretch your left adductors.   You should feel a stretch in your left inner thigh. Hold for __________ seconds.   Return to the upright position to relax your leg muscles.   Continuing to keep your chest upright, bend straight forward at your waist to stretch your hamstrings.   You should feel a stretch behind both of your thighs and/or knees. Hold for __________ seconds.   Return to the upright position to relax your leg muscles.   Repeat steps 2 through 4.  Repeat __________ times. Complete this exercise __________ times per day.  STRENGTHENING EXERCISES - Prepatellar Bursitis  These exercises may help you when beginning to rehabilitate your injury. They may resolve your symptoms with or without further involvement from your physician, physical therapist or athletic trainer. While completing these exercises, remember:   Muscles can gain both the endurance and the strength needed for everyday activities through controlled   exercises.   Complete these exercises as instructed by your physician, physical therapist or athletic trainer. Progress the resistance and repetitions only as guided.  STRENGTH - Quadriceps, Isometrics  Lie on your back with your right / left leg extended and your opposite knee bent.   Gradually tense the muscles in the front of your right / left thigh. You should see either your kneecap slide up toward your hip or increased dimpling just above the knee. This motion will push the back of the knee down toward the floor/mat/bed on which you are lying.   Hold the muscle as tight as you can without increasing your pain for __________ seconds.   Relax the muscles  slowly and completely in between each repetition.  Repeat __________ times. Complete this exercise __________ times per day.  STRENGTH - Quadriceps, Short Arcs   Lie on your back. Place a __________ inch towel roll under your knee so that the knee slightly bends.   Raise only your lower leg by tightening the muscles in the front of your thigh. Do not allow your thigh to rise.   Hold this position for __________ seconds.  Repeat __________ times. Complete this exercise __________ times per day.  OPTIONAL ANKLE WEIGHTS: Begin with ____________________, but DO NOT exceed ____________________. Increase in1 lb/0.5 kg increments.  STRENGTH - Quadriceps, Straight Leg Raises  Quality counts! Watch for signs that the quadriceps muscle is working to insure you are strengthening the correct muscles and not "cheating" by substituting with healthier muscles.  Lay on your back with your right / left leg extended and your opposite knee bent.   Tense the muscles in the front of your right / left thigh. You should see either your kneecap slide up or increased dimpling just above the knee. Your thigh may even quiver.   Tighten these muscles even more and raise your leg 4 to 6 inches off the floor. Hold for __________ seconds.   Keeping these muscles tense, lower your leg.   Relax the muscles slowly and completely in between each repetition.  Repeat __________ times. Complete this exercise __________ times per day.  STRENGTH - Quadriceps, Step-Ups   Use a thick book, step or step stool that is __________ inches tall.   Holding a wall or counter for balance only, not support.   Slowly step-up with your right / left foot, keeping your knee in line with your hip and foot. Do not allow your knee to bend so far that you cannot see your toes.   Slowly unlock your knee and lower yourself to the starting position. Your muscles, not gravity, should lower you.  Repeat __________ times. Complete this exercise  __________ times per day. Document Released: 01/25/2005 Document Revised: 01/14/2011 Document Reviewed: 05/09/2008 [facility] Patient Information 2012 [place], [place].

## 2011-04-28 NOTE — ED Provider Notes (Signed)
Medical screening examination/treatment/procedure(s) were performed by non-physician practitioner and as supervising physician I was immediately available for consultation/collaboration.  Rhian Funari T Krrish Freund, MD 04/28/11 0930 

## 2011-06-29 ENCOUNTER — Emergency Department (HOSPITAL_COMMUNITY): Payer: Self-pay

## 2011-06-29 ENCOUNTER — Emergency Department (HOSPITAL_COMMUNITY)
Admission: EM | Admit: 2011-06-29 | Discharge: 2011-06-29 | Disposition: A | Discharge status: Home / Self Care | Payer: Self-pay | Attending: Emergency Medicine | Admitting: Emergency Medicine

## 2011-06-29 ENCOUNTER — Encounter (HOSPITAL_COMMUNITY): Payer: Self-pay | Admitting: *Deleted

## 2011-06-29 DIAGNOSIS — I10 Essential (primary) hypertension: Secondary | ICD-10-CM | POA: Insufficient documentation

## 2011-06-29 DIAGNOSIS — R1030 Lower abdominal pain, unspecified: Secondary | ICD-10-CM

## 2011-06-29 DIAGNOSIS — R112 Nausea with vomiting, unspecified: Secondary | ICD-10-CM | POA: Insufficient documentation

## 2011-06-29 DIAGNOSIS — Z79899 Other long term (current) drug therapy: Secondary | ICD-10-CM | POA: Insufficient documentation

## 2011-06-29 DIAGNOSIS — N509 Disorder of male genital organs, unspecified: Secondary | ICD-10-CM | POA: Insufficient documentation

## 2011-06-29 DIAGNOSIS — R109 Unspecified abdominal pain: Secondary | ICD-10-CM | POA: Insufficient documentation

## 2011-06-29 DIAGNOSIS — R350 Frequency of micturition: Secondary | ICD-10-CM | POA: Insufficient documentation

## 2011-06-29 LAB — BASIC METABOLIC PANEL
BUN: 8 mg/dL (ref 6–23)
Creatinine, Ser: 1.16 mg/dL (ref 0.50–1.35)
GFR calc Af Amer: 85 mL/min — ABNORMAL LOW (ref >90)
GFR calc non Af Amer: 73 mL/min — ABNORMAL LOW (ref >90)

## 2011-06-29 LAB — DIFFERENTIAL
Basophils Relative: 1 % (ref 0–1)
Eosinophils Absolute: 0.2 10*3/uL (ref 0.0–0.7)
Monocytes Absolute: 0.3 10*3/uL (ref 0.1–1.0)
Monocytes Relative: 5 % (ref 3–12)
Neutrophils Relative %: 66 % (ref 43–77)

## 2011-06-29 LAB — CBC
HCT: 47.1 % (ref 39.0–52.0)
Hemoglobin: 16.3 g/dL (ref 13.0–17.0)
MCH: 30 pg (ref 26.0–34.0)
MCHC: 34.6 g/dL (ref 30.0–36.0)
RDW: 12.3 % (ref 11.5–15.5)

## 2011-06-29 LAB — URINALYSIS, ROUTINE W REFLEX MICROSCOPIC
Bilirubin Urine: NEGATIVE
Ketones, ur: NEGATIVE mg/dL
Nitrite: NEGATIVE
Protein, ur: NEGATIVE mg/dL
pH: 5.5 (ref 5.0–8.0)

## 2011-06-29 MED ORDER — SODIUM CHLORIDE 0.9 % IV SOLN: INTRAVENOUS | Status: DC

## 2011-06-29 MED ORDER — DOXYCYCLINE HYCLATE 100 MG PO CAPS: 100.0000 mg | ORAL_CAPSULE | Freq: Two times a day (BID) | ORAL | Status: AC

## 2011-06-29 MED ORDER — ONDANSETRON HCL 4 MG/2ML IJ SOLN
4.0000 mg | Freq: Once | INTRAMUSCULAR | Status: AC
  Administered 2011-06-29: 4 mg via INTRAVENOUS
  Filled 2011-06-29: qty 2

## 2011-06-29 MED ORDER — SODIUM CHLORIDE 0.9 % IV BOLUS (SEPSIS)
250.0000 mL | Freq: Once | INTRAVENOUS | Status: AC
  Administered 2011-06-29: 250 mL via INTRAVENOUS

## 2011-06-29 MED ORDER — HYDROMORPHONE HCL PF 1 MG/ML IJ SOLN
1.0000 mg | Freq: Once | INTRAMUSCULAR | Status: AC
  Administered 2011-06-29: 1 mg via INTRAVENOUS
  Filled 2011-06-29: qty 1

## 2011-06-29 MED ORDER — NAPROXEN 500 MG PO TABS: 500.0000 mg | ORAL_TABLET | Freq: Two times a day (BID) | ORAL | Status: DC

## 2011-06-29 NOTE — ED Notes (Signed)
No rooms available in CDU at this time.

## 2011-06-29 NOTE — ED Provider Notes (Addendum)
History   This chart was scribed for Shelda Jakes, MD by Brooks Sailors. The patient was seen in room STRE6/STRE6. Patient's care was started at 1239.   CSN: [id number]  Arrival date & time 06/29/11  1239   First MD Initiated Contact with Patient 06/29/11 1322      Chief Complaint  Patient presents with  . Groin Pain    bilateral    (Consider location/radiation/quality/duration/timing/severity/associated sxs/prior treatment) HPI Leon Pena is a 46 y.o. male who presents to the Emergency Department complaining of moderate scrotal pain onset three weeks ago and persistent since with associated frequency. Over three weeks course has been worsening. Pt says the pain radiates to his left flank and that the pain is more severe in his left testicle. Pt says there is tenderness to palpitation of the testicle. Pain is aggravated by laying down with a pain of 8/10. 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 H/o of kidney stones. Denies penile discharge.     Past Medical History  Diagnosis Date  . Hypertension     Past Surgical History  Procedure Date  . Kidney surgery   . Cholecystectomy     No family history on file.  History  Substance Use Topics  . Smoking status: Never Smoker   . Smokeless tobacco: Not on file  . Alcohol Use: No      Review of Systems  Constitutional: Negative for fever.  Gastrointestinal: Positive for nausea and vomiting. Negative for abdominal pain and diarrhea.  Genitourinary: Positive for frequency, flank pain and testicular pain. Negative for dysuria and difficulty urinating.  All other systems reviewed and are negative.    Allergies  Latex; Compazine; and Promethazine hcl  Home Medications   Current Outpatient Rx  Name Route Sig Dispense Refill  . BUPROPION HCL ER (XL) 150 MG PO TB24 Oral Take 150 mg by mouth daily.    Marland Kitchen LAMOTRIGINE 25 MG PO TABS Oral Take 100 mg by mouth every evening. Pt takes 2 tabs of 25mg  for a 50 mg dose      BP  129/84  Pulse 71  Temp(Src) 97.9 F (36.6 C) (Oral)  Resp 18  SpO2 98%  Physical Exam  Nursing note and vitals reviewed. Constitutional: He is oriented to person, place, and time. He appears well-developed and well-nourished.  HENT:  Head: Normocephalic and atraumatic.  Eyes: Conjunctivae and EOM are normal. Pupils are equal, round, and reactive to light.  Neck: Normal range of motion. Neck supple.  Cardiovascular: Normal rate and regular rhythm.   Pulmonary/Chest: Effort normal and breath sounds normal.  Abdominal: Soft. Bowel sounds are normal.       Two well healed scars on both flanks for kidney surgeries. Well healed smaller scars for laparoscopic cholecystectomy.   Genitourinary:       Tenderness left epididymus, no hernia on left and right. Left groin hernia repair with mesh. Well healed scar where hernia was repaired.   Musculoskeletal: Normal range of motion.  Neurological: He is alert and oriented to person, place, and time.  Skin: Skin is warm and dry.  Psychiatric: He has a normal mood and affect.    ED Course  Procedures (including critical care time) Pt seen at 1350   Labs Reviewed  URINALYSIS, ROUTINE W REFLEX MICROSCOPIC  CBC  DIFFERENTIAL  BASIC METABOLIC PANEL   Results for orders placed during the hospital encounter of 06/29/11  URINALYSIS, ROUTINE W REFLEX MICROSCOPIC      Component Value Range  Color, Urine YELLOW  YELLOW    APPearance CLEAR  CLEAR    Specific Gravity, Urine 1.021  1.005 - 1.030    pH 5.5  5.0 - 8.0    Glucose, UA NEGATIVE  NEGATIVE (mg/dL)   Hgb urine dipstick NEGATIVE  NEGATIVE    Bilirubin Urine NEGATIVE  NEGATIVE    Ketones, ur NEGATIVE  NEGATIVE (mg/dL)   Protein, ur NEGATIVE  NEGATIVE (mg/dL)   Urobilinogen, UA 0.2  0.0 - 1.0 (mg/dL)   Nitrite NEGATIVE  NEGATIVE    Leukocytes, UA NEGATIVE  NEGATIVE   CBC      Component Value Range   WBC 7.6  4.0 - 10.5 (K/uL)   RBC 5.44  4.22 - 5.81 (MIL/uL)   Hemoglobin 16.3  13.0 -  17.0 (g/dL)   HCT 40.9  81.1 - 91.4 (%)   MCV 86.6  78.0 - 100.0 (fL)   MCH 30.0  26.0 - 34.0 (pg)   MCHC 34.6  30.0 - 36.0 (g/dL)   RDW 78.2  95.6 - 21.3 (%)   Platelets 281  150 - 400 (K/uL)  DIFFERENTIAL      Component Value Range   Neutrophils Relative 66  43 - 77 (%)   Neutro Abs 5.1  1.7 - 7.7 (K/uL)   Lymphocytes Relative 25  12 - 46 (%)   Lymphs Abs 1.9  0.7 - 4.0 (K/uL)   Monocytes Relative 5  3 - 12 (%)   Monocytes Absolute 0.3  0.1 - 1.0 (K/uL)   Eosinophils Relative 3  0 - 5 (%)   Eosinophils Absolute 0.2  0.0 - 0.7 (K/uL)   Basophils Relative 1  0 - 1 (%)   Basophils Absolute 0.1  0.0 - 0.1 (K/uL)    No results found.   1. Flank pain   2. 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  Patient has had trouble with kidney stones in the right kidney in the past.  Patient with three-week history of some left flank pain more recently involving the left scrotal area, worse in the past 2, days.  On examination, patient has some tenderness to the left testicle to clean the area of the epididymis may be consistent with epididymitis, but with a history of one kidney and a history of stones frequently in the, past.  We'll need CT abdomen pelvis without contrast to evaluate that.  Also will need evaluation of his renal function.  Also will get an ultrasound of the left scrotum.  Do not believe this related to a tumor could be epididymitis.  No evidence of a hernia.  Patient has had a hernia repair on the left side in the past.  Right side is normal.  Medical screening examination/treatment/procedure(s) were conducted as a shared visit with non-physician practitioner(s) and myself.  I personally evaluated the patient during the encounter The patient was moved to CDU pending.  The studies.  I personally performed the services described in this documentation, which was scribed in my presence. The recorded information has been  reviewed and considered.         Shelda Jakes, MD 06/29/11 1438  Shelda Jakes, MD 07/03/11 [phone]

## 2011-06-29 NOTE — ED Notes (Signed)
Pt is here with groin, scrotal pain bilaterally.  Pt sts has had this going on for a while.  Pt can urinate.  Pt sts pain worsens with laying down

## 2011-06-29 NOTE — ED Notes (Signed)
Pt ambulated to and from restroom. 

## 2011-06-29 NOTE — ED Notes (Signed)
Patient transported to Ultrasound on stretcher by transporter

## 2011-06-29 NOTE — Discharge Instructions (Signed)
Epididymitis Epididymitis is a swelling (inflammation) of the epididymis. The epididymis is a cord-like structure along the back part of the testicle. Epididymitis is usually, but not always, caused by infection. This is usually a sudden problem beginning with chills, fever and pain behind the scrotum and in the testicle. There may be swelling and redness of the testicle. DIAGNOSIS  Physical examination will reveal a tender, swollen epididymis. Sometimes, cultures are obtained from the urine or from prostate secretions to help find out if there is an infection or if the cause is a different problem. Sometimes, blood work is performed to see if your white blood cell count is elevated and if a germ (bacterial) or viral infection is present. Using this knowledge, an appropriate medicine which kills germs (antibiotic) can be chosen by your caregiver. A viral infection causing epididymitis will most often go away (resolve) without treatment. HOME CARE INSTRUCTIONS   Hot sitz baths for 20 minutes, 4 times per day, may help relieve pain.   Only take over-the-counter or prescription medicines for pain, discomfort or fever as directed by your caregiver.   Take all medicines, including antibiotics, as directed. Take the antibiotics for the full prescribed length of time even if you are feeling better.   It is very important to keep all follow-up appointments.  SEEK IMMEDIATE MEDICAL CARE IF:   You have a fever.   You have pain not relieved with medicines.   You have any worsening of your problems.   Your pain seems to come and go.   You develop pain, redness, and swelling in the scrotum and surrounding areas.  MAKE SURE YOU:   Understand these instructions.   Will watch your condition.   Will get help right away if you are not doing well or get worse.  Document Released: 01/23/2000 Document Revised: 01/14/2011 Document Reviewed: 12/12/2008 Exit[employer] Patient Information 2012 ExitCare,  [place]  Varicocele A varicocele is a swelling of veins in the scrotum (the bag of skin that contains the testicles). It is most common in young men. It occurs most often on the left side. Small or painless varicoceles do not need treatment. Most often, this is not a serious problem, but further tests may be needed to confirm the diagnosis. Surgery may be needed if complications of varicoceles arise. Rarely, varicoceles can reoccur after surgery. CAUSES  The swelling is due to blood backing up in the vein that leads from the testicle back to the body. Blood backs up because the valves inside the vein are not working properly. Veins normally return blood to the heart. Valves in veins are supposed to be one-way valves. They should not allow blood to flow backwards. If the valves do not work well, blood can pool in a vein and make it swell. The same thing happens with varicose veins in the leg. SYMPTOMS  A varicocele most often causes no symptoms. When they occur, symptoms include:   Swelling on one side of the scrotum.   Swelling that is more obvious when standing up.   A lumpy feeling in the scrotum.   Heaviness on one side of the scrotum.   Dull ache in the scrotum, especially after exercise or prolonged standing or sitting.   Slower growth or reduced size of the testicle on the side of the varicocele (in young males).   Problems with fertility can arise if the testicle does not grow normally.  DIAGNOSIS  Varicocele is usually diagnosed by a physical exam. Sometimes ultrasonography is done.  TREATMENT  Usually, varicoceles need no treatment. They are often routinely monitored on exam by your caregiver to ensure they do not slow the growth of the testicle on that side. Treatment may be needed if:  The varicocele is large.   There is a lot of pain.   The varicocele causes a decrease in the size of the testicle in a growing adolescent.   The other testicle is absent or not normal.    Varicoceles are found on both sides of the scrotum.   There is pain when exercising.   There are fertility problems.  There are two types of treatment:  Surgery. The surgeon ties off the swollen veins. Surgery may be done with an incision in the skin or through a laparoscope. The surgery is usually done in an outpatient setting. Outpatient means there is no overnight stay in a hospital.   Embolization. A small tube is placed in a vein and guided into the swollen veins. X-rays are used to guide the small tube. Tiny metal coils or other blocking items are put through the tube. This blocks swollen veins and the flow of blood. This is usually done in an outpatient setting without the use of general anesthesia.  HOME CARE INSTRUCTIONS  To decrease discomfort:  Wear supportive underwear.   Use an athletic supporter for sports.   Only take over-the-counter or prescription medicines for pain or discomfort as directed by your caregiver.  SEEK MEDICAL CARE IF:   Pain is increasing.   Swelling does not decrease when lying down.   Testicle is smaller.   The testicle becomes enlarged, swollen, red, or painful.  Document Released: 05/03/2000 Document Revised: 01/14/2011 Document Reviewed: 05/07/2009 [facility] Patient Information 2012 [place], [place]  .Hydrocele, Adult Fluid can collect around the testicles. This fluid forms in a sac. This condition is called a hydrocele. The collected fluid causes swelling of the scrotum. Usually, it affects just one testicle. Most of the time, the condition does not cause pain. Sometimes, the hydrocele goes away on its own. Other times, surgery is needed to get rid of the fluid. CAUSES A hydrocele does not develop often. Different things can cause a hydrocele in a man, including:  Injury to the scrotum.   Infection.   X-ray of the area around the scrotum.   A tumor or cancer of the testicle.   Twisting of a testicle.   Decreased blood flow to the  scrotum.  SYMPTOMS   Swelling without pain. The hydrocele feels like a water-filled balloon.   Swelling with pain. This can occur if the hydrocele was caused by infection or twisting.   Mild discomfort in the scrotum.   The hydrocele may feel heavy.   Swelling that gets smaller when you lie down.  DIAGNOSIS  Your caregiver will do a physical exam to decide if you have a hydrocele. This may include:  Asking questions about your overall health, today and in the past. Your caregiver may ask about any injuries, X-rays, or infections.   Pushing on your abdomen or asking you to change positions to see if the size of the hydrocele changes.   Shining a light through the scrotum (transillumination) to see if the fluid inside the scrotum is clear.   Blood tests and urine tests to check for infection.   Imaging studies that take pictures of the scrotum and testicles.  TREATMENT  Treatment depends in part on what caused the condition. Options include:  Watchful waiting. Your caregiver checks the  hydrocele every so often.   Different surgeries to drain the fluid.   A needle may be put into the scrotum to drain fluid (needle aspiration). Fluid often returns after this type of treatment.   A cut (incision) may be made in the scrotum to remove the fluid sac (hydrocelectomy).   An incision may be made in the groin to repair a hydrocele that has contact with abdominal fluids (communicating hydrocele).   Medicines to treat an infection (antibiotics).  HOME CARE INSTRUCTIONS  What you need to do at home may depend on the cause of the hydrocele and type of treatment. In general:  Take all medicine as directed by your caregiver. Follow the directions carefully.   Ask your caregiver if there is anything you should not do while you recover (activities, lifting, work, sex).   If you had surgery to repair a communicating hydrocele, recovery time may vary. Ask you caregiver about your recovery  time.   Avoid heavy lifting for 4 to 6 weeks.   If you had an incision on the scrotum or groin, wash it for 2 to 3 days after surgery. Do this as long as the skin is closed and there are no gaps in the wound. Wash gently, and avoid rubbing the incision.   Keep all follow-up appointments.  SEEK MEDICAL CARE IF:   Your scrotum seems to be getting larger.   The area becomes more and more uncomfortable.  SEEK IMMEDIATE MEDICAL CARE IF:  You have a fever. Document Released: 07/15/2009 Document Revised: 01/14/2011 Document Reviewed: 07/15/2009 [facility] Patient Information 2012 [place], [place].Marland Kitchen  RESOURCE GUIDE  Dental Problems  Patients with Medicaid: [facility] [phone] [address]                                           [phone] [address] Phone:  [phone]                                                  Phone:  [phone]  If unable to pay or uninsured, contact:  [facility] or [facility]. to become qualified for the adult dental clinic.  Chronic Pain Problems Contact [facility]  [phone] Patients need to be referred by their primary care doctor.  Insufficient Money for Medicine Contact [employer]:  call "211" or [facility] [phone].  No Primary Care Doctor Call [facility]  [phone] Other agencies that provide inexpensive medical care    [facility]  [phone]    [facility]  [phone]    [facility]  [phone]    [facility]  [phone]    [facility]  [phone]    [facility]  [phone]  Psychological Services [facility]  [phone] [facility]  [phone] [facility]   [phone] (emergency services [phone])  Substance Abuse Resources [facility]  [phone] [facility] [phone] [facility] [phone] Floydene Flock  [phone] Residential & Outpatient Substance Abuse Program  [phone]  Abuse/Neglect [facility] Child Abuse  Hotline 872-827-1213 [facility] Child Abuse Hotline [phone] [phone] (After Hours)  Emergency Shelter [facility] [phone]  Maternity Homes [facility] [phone] [facility] [phone]  MRSA Hotline #:   539-264-5838    [facility] Resources  [facility]     [employer]                          [facility] Dept. [address] [place]                       [address]      [address]  Blondell Reveal Phone:  [phone]                                   Phone:  [phone]                 Phone:  [phone]  [facility] Phone:  [phone]  [facility] Child Abuse Hotline [phone] (After Hours)

## 2011-10-04 ENCOUNTER — Inpatient Hospital Stay (HOSPITAL_COMMUNITY)
Admission: AD | Admit: 2011-10-04 | Discharge: 2011-10-07 | DRG: 885 | Disposition: A | Discharge status: Home / Self Care | Payer: Federal, State, Local not specified - Other | Source: Ambulatory Visit | Attending: Psychiatry | Admitting: Psychiatry

## 2011-10-04 ENCOUNTER — Emergency Department (HOSPITAL_COMMUNITY)
Admission: EM | Admit: 2011-10-04 | Discharge: 2011-10-04 | Disposition: A | Discharge status: Other Acute Inpatient Hospital | Payer: Self-pay | Attending: Emergency Medicine | Admitting: Emergency Medicine

## 2011-10-04 ENCOUNTER — Encounter (HOSPITAL_COMMUNITY): Payer: Self-pay | Admitting: *Deleted

## 2011-10-04 DIAGNOSIS — R3129 Other microscopic hematuria: Secondary | ICD-10-CM | POA: Insufficient documentation

## 2011-10-04 DIAGNOSIS — I10 Essential (primary) hypertension: Secondary | ICD-10-CM | POA: Insufficient documentation

## 2011-10-04 DIAGNOSIS — Z9089 Acquired absence of other organs: Secondary | ICD-10-CM | POA: Insufficient documentation

## 2011-10-04 DIAGNOSIS — F0781 Postconcussional syndrome: Secondary | ICD-10-CM | POA: Diagnosis present

## 2011-10-04 DIAGNOSIS — F411 Generalized anxiety disorder: Secondary | ICD-10-CM | POA: Diagnosis present

## 2011-10-04 DIAGNOSIS — Z905 Acquired absence of kidney: Secondary | ICD-10-CM

## 2011-10-04 DIAGNOSIS — F339 Major depressive disorder, recurrent, unspecified: Secondary | ICD-10-CM | POA: Diagnosis present

## 2011-10-04 DIAGNOSIS — Z79899 Other long term (current) drug therapy: Secondary | ICD-10-CM

## 2011-10-04 DIAGNOSIS — F3289 Other specified depressive episodes: Secondary | ICD-10-CM | POA: Insufficient documentation

## 2011-10-04 DIAGNOSIS — N200 Calculus of kidney: Secondary | ICD-10-CM | POA: Diagnosis present

## 2011-10-04 DIAGNOSIS — F332 Major depressive disorder, recurrent severe without psychotic features: Principal | ICD-10-CM | POA: Diagnosis present

## 2011-10-04 DIAGNOSIS — F329 Major depressive disorder, single episode, unspecified: Secondary | ICD-10-CM | POA: Insufficient documentation

## 2011-10-04 HISTORY — DX: Depression, unspecified: F32.A

## 2011-10-04 HISTORY — DX: Major depressive disorder, single episode, unspecified: F32.9

## 2011-10-04 LAB — URINALYSIS, ROUTINE W REFLEX MICROSCOPIC
Glucose, UA: NEGATIVE mg/dL
Ketones, ur: NEGATIVE mg/dL
Leukocytes, UA: NEGATIVE
Protein, ur: NEGATIVE mg/dL

## 2011-10-04 LAB — URINE MICROSCOPIC-ADD ON

## 2011-10-04 LAB — ETHANOL: Alcohol, Ethyl (B): <11 mg/dL (ref 0–11)

## 2011-10-04 LAB — CBC
MCHC: 36.1 g/dL — ABNORMAL HIGH (ref 30.0–36.0)
Platelets: 302 10*3/uL (ref 150–400)
RDW: 12.6 % (ref 11.5–15.5)

## 2011-10-04 LAB — RAPID URINE DRUG SCREEN, HOSP PERFORMED
Amphetamines: NOT DETECTED
Opiates: NOT DETECTED
Tetrahydrocannabinol: NOT DETECTED

## 2011-10-04 LAB — COMPREHENSIVE METABOLIC PANEL
ALT: 27 U/L (ref 0–53)
Albumin: 4.7 g/dL (ref 3.5–5.2)
Alkaline Phosphatase: 59 U/L (ref 39–117)
Potassium: 4.6 mEq/L (ref 3.5–5.1)
Sodium: 136 mEq/L (ref 135–145)
Total Protein: 7.3 g/dL (ref 6.0–8.3)

## 2011-10-04 MED ORDER — ACETAMINOPHEN 325 MG PO TABS: 650.0000 mg | ORAL_TABLET | ORAL | Status: DC | PRN

## 2011-10-04 MED ORDER — LORAZEPAM 1 MG PO TABS
1.0000 mg | ORAL_TABLET | Freq: Three times a day (TID) | ORAL | Status: DC | PRN
  Administered 2011-10-04: 1 mg via ORAL
  Filled 2011-10-04: qty 1

## 2011-10-04 MED ORDER — IBUPROFEN 600 MG PO TABS: 600.0000 mg | ORAL_TABLET | Freq: Three times a day (TID) | ORAL | Status: DC | PRN

## 2011-10-04 MED ORDER — BUPROPION HCL ER (XL) 150 MG PO TB24
150.0000 mg | ORAL_TABLET | Freq: Every day | ORAL | Status: DC
  Administered 2011-10-04: 150 mg via ORAL
  Filled 2011-10-04: qty 1

## 2011-10-04 MED ORDER — MAGNESIUM HYDROXIDE 400 MG/5ML PO SUSP
30.0000 mL | Freq: Every day | ORAL | Status: DC | PRN
  Filled 2011-10-04: qty 30

## 2011-10-04 MED ORDER — LAMOTRIGINE 100 MG PO TABS
100.0000 mg | ORAL_TABLET | Freq: Every evening | ORAL | Status: DC
  Administered 2011-10-04: 100 mg via ORAL
  Filled 2011-10-04: qty 1

## 2011-10-04 MED ORDER — NICOTINE 21 MG/24HR TD PT24: 21.0000 mg | MEDICATED_PATCH | Freq: Every day | TRANSDERMAL | Status: DC

## 2011-10-04 MED ORDER — TRAZODONE HCL 50 MG PO TABS
50.0000 mg | ORAL_TABLET | Freq: Every evening | ORAL | Status: DC | PRN
  Filled 2011-10-04: qty 1

## 2011-10-04 MED ORDER — ACETAMINOPHEN 325 MG PO TABS: 650.0000 mg | ORAL_TABLET | Freq: Four times a day (QID) | ORAL | Status: DC | PRN

## 2011-10-04 NOTE — ED Notes (Signed)
Pt reports severe depression with SI. Pt reports hx of several SAs in the past.  Pt reports running out of his depression meds x 3 months, reports he ran out and not able to f/u d/t not having a job.  Pt reports going through 2 marriages x 3 years and just recently moved back to [place] thinking that it would be better.  Pt reports riding a bus to the ED today.  States that he does not have a car.  Pt is calm and cooperative at this time.  Pt reports being seen at [facility] for same.

## 2011-10-04 NOTE — ED Notes (Signed)
Sitter at bedside.

## 2011-10-04 NOTE — ED Provider Notes (Addendum)
History     CSN: [id number]  Arrival date & time 10/04/11  1215   First MD Initiated Contact with Patient 10/04/11 1342      Chief Complaint  Patient presents with  . Medical Clearance    suicidal    (Consider location/radiation/quality/duration/timing/severity/associated sxs/prior treatment) The history is provided by the patient.  pt c/o hx depression, and states feeling very depressed in past 3-4 weeks. States is homeless, no job. States feels depressed about prior failed relationships, money, and living arrangement issues. States had been on meds for same in past but doesn't feel they helped. Denies alcohol or drug abuse. States intermittent thoughts of suicide in past, including recently. No specific plan or attempt. Denies any other recent health issues or recent illness.      Past Medical History  Diagnosis Date  . Hypertension   . Depression     Past Surgical History  Procedure Date  . Kidney surgery   . Cholecystectomy     No family history on file.  History  Substance Use Topics  . Smoking status: Never Smoker   . Smokeless tobacco: Not on file  . Alcohol Use: No      Review of Systems  Constitutional: Negative for fever.  HENT: Negative for neck pain.   Eyes: Negative for redness.  Respiratory: Negative for shortness of breath.   Cardiovascular: Negative for chest pain.  Gastrointestinal: Negative for abdominal pain.  Genitourinary: Negative for flank pain.  Musculoskeletal: Negative for back pain.  Skin: Negative for rash.  Neurological: Negative for headaches.  Hematological: Does not bruise/bleed easily.  Psychiatric/Behavioral: Positive for dysphoric mood.    Allergies  Latex; Compazine; and Promethazine hcl  Home Medications   Current Outpatient Rx  Name Route Sig Dispense Refill  . BUPROPION HCL ER (XL) 150 MG PO TB24 Oral Take 150 mg by mouth daily.    Marland Kitchen LAMOTRIGINE 25 MG PO TABS Oral Take 100 mg by mouth every evening. Pt takes 2  tabs of 25mg  for a 50 mg dose    . LORAZEPAM 1 MG PO TABS Oral Take 1 mg by mouth every 8 (eight) hours. Anxiety    . NAPROXEN 500 MG PO TABS Oral Take 1 tablet (500 mg total) by mouth 2 (two) times daily. 30 tablet 0    BP 119/82  Pulse 61  Temp 98.1 F (36.7 C) (Oral)  Resp 20  SpO2 99%  Physical Exam  Nursing note and vitals reviewed. Constitutional: He is oriented to person, place, and time. He appears well-developed and well-nourished. No distress.  HENT:  Head: Atraumatic.  Mouth/Throat: Oropharynx is clear and moist.  Eyes: Conjunctivae are normal. Pupils are equal, round, and reactive to light.  Neck: Neck supple. No tracheal deviation present. No thyromegaly present.  Cardiovascular: Normal rate.   Pulmonary/Chest: Effort normal. No accessory muscle usage. No respiratory distress.  Abdominal: Soft. He exhibits no distension. There is no tenderness.  Musculoskeletal: Normal range of motion.  Neurological: He is alert and oriented to person, place, and time.  Skin: Skin is warm and dry.  Psychiatric:       Depressed mood.     ED Course  Procedures (including critical care time)   Labs Reviewed  CBC  COMPREHENSIVE METABOLIC PANEL  URINE RAPID DRUG SCREEN (HOSP PERFORMED)  ETHANOL  URINALYSIS, ROUTINE W REFLEX MICROSCOPIC   Results for orders placed during the hospital encounter of 10/04/11  CBC      Component Value Range  WBC 9.6  4.0 - 10.5 K/uL   RBC 5.54  4.22 - 5.81 MIL/uL   Hemoglobin 17.3 (*) 13.0 - 17.0 g/dL   HCT 16.1  09.6 - 04.5 %   MCV 86.5  78.0 - 100.0 fL   MCH 31.2  26.0 - 34.0 pg   MCHC 36.1 (*) 30.0 - 36.0 g/dL   RDW 40.9  81.1 - 91.4 %   Platelets 302  150 - 400 K/uL  COMPREHENSIVE METABOLIC PANEL      Component Value Range   Sodium 136  135 - 145 mEq/L   Potassium 4.6  3.5 - 5.1 mEq/L   Chloride 102  96 - 112 mEq/L   CO2 23  19 - 32 mEq/L   Glucose, Bld 93  70 - 99 mg/dL   BUN 10  6 - 23 mg/dL   Creatinine, Ser 7.82  0.50 - 1.35  mg/dL   Calcium 9.8  8.4 - 95.6 mg/dL   Total Protein 7.3  6.0 - 8.3 g/dL   Albumin 4.7  3.5 - 5.2 g/dL   AST 21  0 - 37 U/L   ALT 27  0 - 53 U/L   Alkaline Phosphatase 59  39 - 117 U/L   Total Bilirubin 0.6  0.3 - 1.2 mg/dL   GFR calc non Af Amer 83 (*) >90 mL/min   GFR calc Af Amer >90  >90 mL/min  URINE RAPID DRUG SCREEN (HOSP PERFORMED)      Component Value Range   Opiates NONE DETECTED  NONE DETECTED   Cocaine NONE DETECTED  NONE DETECTED   Benzodiazepines NONE DETECTED  NONE DETECTED   Amphetamines NONE DETECTED  NONE DETECTED   Tetrahydrocannabinol NONE DETECTED  NONE DETECTED   Barbiturates NONE DETECTED  NONE DETECTED  ETHANOL      Component Value Range   Alcohol, Ethyl (B) <11  0 - 11 mg/dL  URINALYSIS, ROUTINE W REFLEX MICROSCOPIC      Component Value Range   Color, Urine YELLOW  YELLOW   APPearance CLOUDY (*) CLEAR   Specific Gravity, Urine 1.024  1.005 - 1.030   pH 5.5  5.0 - 8.0   Glucose, UA NEGATIVE  NEGATIVE mg/dL   Hgb urine dipstick LARGE (*) NEGATIVE   Bilirubin Urine NEGATIVE  NEGATIVE   Ketones, ur NEGATIVE  NEGATIVE mg/dL   Protein, ur NEGATIVE  NEGATIVE mg/dL   Urobilinogen, UA 0.2  0.0 - 1.0 mg/dL   Nitrite NEGATIVE  NEGATIVE   Leukocytes, UA NEGATIVE  NEGATIVE  URINE MICROSCOPIC-ADD ON      Component Value Range   Squamous Epithelial / LPF RARE  RARE   WBC, UA 0-2  <3 WBC/hpf   RBC / HPF 11-20  <3 RBC/hpf   Bacteria, UA RARE  RARE       MDM  Labs. Act team called.   telelpsych consult.    Reviewed nursing notes and prior charts for additional history.   abd soft nt. No trauma or fall. No flank or abd pain. No dysuria. Discussed microscopic hematuria and need for urology/pcp follow up for same.   Signed out to oncoming edp to f/u with act assessment, and arrange psych placement. Will need outpt f/u ua results.         Suzi Roots, MD 10/04/11 1357  Suzi Roots, MD 10/04/11 [phone]

## 2011-10-04 NOTE — ED Notes (Signed)
ACT team at bedside.  

## 2011-10-04 NOTE — ED Notes (Signed)
Pt reports "oh no i dont want to kill anybody but i don't want to be here."

## 2011-10-04 NOTE — BH Assessment (Signed)
Assessment Note   Leon Pena is an 46 y.o. male that contacted 911 while having thoughts of jumping off of a bridge.  Pt endorses ongoing SI a/w his 2nd divorce in three years, losing custody of his daughter, homelessness and minimal supports.  Pt reports that he has attempted to go to [facility] only to not be able to afford the Lamictal, Welbutrin and Lorazepam that he has been prescribed.  Pt reports not taking medication in the past three months d/t moving and the expense.  Pt reports minimal supports and a family history of Schizophrenia and BiPolar.  Pt has previously been hospitalized at [facility], [facility], in [place] and in [place].  Pt denies any history of SA treatment or misuse.  Pt denies any psychosis, currently, or by history.  Pt is not currently able to contract for safety.    Axis I: Bipolar, Depressed Axis II: Deferred Axis III:  Past Medical History  Diagnosis Date  . Hypertension   . Depression    Axis IV: housing problems, other psychosocial or environmental problems, problems related to legal system/crime, problems with access to health care services and problems with primary support group Axis V: 21-30 behavior considerably influenced by delusions or hallucinations OR serious impairment in judgment, communication OR inability to function in almost all areas  Past Medical History:  Past Medical History  Diagnosis Date  . Hypertension   . Depression     Past Surgical History  Procedure Date  . Kidney surgery   . Cholecystectomy     Family History: No family history on file.  Social History:  reports that he has never smoked. He does not have any smokeless tobacco history on file. He reports that he does not drink alcohol or use illicit drugs.  Additional Social History:  Alcohol / Drug Use Pain Medications: None Prescriptions: None Over the Counter: None History of alcohol / drug use?: No history of alcohol / drug abuse Longest period of sobriety (when/how  long): denies hx  CIWA: CIWA-Ar BP: 119/82 mmHg Pulse Rate: 61  COWS:    Allergies:  Allergies  Allergen Reactions  . Latex Shortness Of Breath and Swelling  . Compazine Other (See Comments)    seziures   . Promethazine Hcl Other (See Comments)    seziures    Home Medications:  (Not in a hospital admission)  OB/GYN Status:  No LMP for male patient.  General Assessment Data Location of Assessment: [facility] ED ACT Assessment: Yes Living Arrangements: Other (Comment) (Homeless-living on streets) Can pt return to current living arrangement?: No Admission Status: Voluntary Is patient capable of signing voluntary admission?: No Transfer from: Acute Hospital Referral Source: Self/Family/Friend  Education Status Is patient currently in school?: No  Risk to self Suicidal Ideation: Yes-Currently Present Suicidal Intent: No Is patient at risk for suicide?: Yes Suicidal Plan?: Yes-Currently Present Specify Current Suicidal Plan: Thoughts of jumping off a bridge Access to Means: Yes Specify Access to Suicidal Means: bridges available What has been your use of drugs/alcohol within the last 12 months?: denies use Previous Attempts/Gestures: Yes How many times?: 3  Other Self Harm Risks: impulsive Triggers for Past Attempts: Unpredictable;None known Intentional Self Injurious Behavior: None Family Suicide History: No Recent stressful life event(s): Conflict (Comment);Trauma (Comment);Turmoil (Comment);Loss (Comment) Persecutory voices/beliefs?: No Depression: Yes Depression Symptoms: Despondent;Isolating;Fatigue;Guilt;Loss of interest in usual pleasures;Feeling worthless/self pity;Feeling angry/irritable Substance abuse history and/or treatment for substance abuse?: No Suicide prevention information given to non-admitted patients: Not applicable  Risk to  Others Homicidal Ideation: No Thoughts of Harm to Others: No Current Homicidal Intent: No Current Homicidal Plan: No Access  to Homicidal Means: No Identified Victim: none per pt History of harm to others?: No Assessment of Violence: None Noted Violent Behavior Description: none per pt Does patient have access to weapons?: No Criminal Charges Pending?: No Does patient have a court date: No  Psychosis Hallucinations: None noted Delusions: None noted  Mental Status Report Eye Contact: Good Motor Activity: Unremarkable Speech: Soft Level of Consciousness: Quiet/awake Mood: Anxious;Depressed;Empty;Ambivalent;Silly;Worthless, low self-esteem Affect: Anxious;Apathetic;Depressed;Sad Anxiety Level: Minimal Thought Processes: Relevant Judgement: Unimpaired Orientation: Person;Place;Time;Situation Obsessive Compulsive Thoughts/Behaviors: Minimal  Cognitive Functioning Concentration: Decreased Memory: Recent Intact;Remote Intact IQ: Average Insight: Fair Impulse Control: Poor Appetite: Fair Weight Loss: 0  Weight Gain: 0  Sleep: Decreased Total Hours of Sleep: 4  Vegetative Symptoms: Decreased grooming  ADLScreening [facility]) Patient's cognitive ability adequate to safely complete daily activities?: Yes Patient able to express need for assistance with ADLs?: Yes Independently performs ADLs?: Yes (appropriate for developmental age)  Abuse/Neglect [facility]) Physical Abuse: Denies Verbal Abuse: Denies Sexual Abuse: Denies  Prior Inpatient Therapy Prior Inpatient Therapy: Yes Prior Therapy Dates: 2013 and beyond Prior Therapy Facilty/Provider(s): in [place], [facility], in [place], [place] and others Reason for Treatment: depression and SI  Prior Outpatient Therapy Prior Outpatient Therapy: Yes Prior Therapy Dates: 2013 and beyond Prior Therapy Facilty/Provider(s): [place], in [place], [place], and others Reason for Treatment: BiPolar  ADL Screening (condition at time of admission) Patient's cognitive ability adequate to safely complete daily activities?: Yes Patient able to express  need for assistance with ADLs?: Yes Independently performs ADLs?: Yes (appropriate for developmental age)       Abuse/Neglect Assessment (Assessment to be complete while patient is alone) Physical Abuse: Denies Verbal Abuse: Denies Sexual Abuse: Denies Exploitation of patient/patient's resources: Denies Self-Neglect: Denies Values / Beliefs Cultural Requests During Hospitalization: None Spiritual Requests During Hospitalization: None   Advance Directives (For Healthcare) Advance Directive: Patient does not have advance directive    Additional Information 1:1 In Past 12 Months?: Yes CIRT Risk: No Elopement Risk: No Does patient have medical clearance?: Yes     Disposition: Please run for possible inpatient treatment.   Disposition Disposition of Patient: Inpatient treatment program Type of inpatient treatment program: Adult  On Site Evaluation by:   Reviewed with Physician:     Angelica Ran 10/04/2011 4:47 PM

## 2011-10-04 NOTE — ED Notes (Signed)
Sandwich given 

## 2011-10-04 NOTE — ED Notes (Signed)
MD at bedside. 

## 2011-10-05 ENCOUNTER — Encounter (HOSPITAL_COMMUNITY): Payer: Self-pay | Admitting: *Deleted

## 2011-10-05 DIAGNOSIS — F411 Generalized anxiety disorder: Secondary | ICD-10-CM | POA: Diagnosis present

## 2011-10-05 DIAGNOSIS — I10 Essential (primary) hypertension: Secondary | ICD-10-CM | POA: Diagnosis present

## 2011-10-05 DIAGNOSIS — F339 Major depressive disorder, recurrent, unspecified: Secondary | ICD-10-CM | POA: Diagnosis present

## 2011-10-05 DIAGNOSIS — Z905 Acquired absence of kidney: Secondary | ICD-10-CM

## 2011-10-05 DIAGNOSIS — F332 Major depressive disorder, recurrent severe without psychotic features: Principal | ICD-10-CM

## 2011-10-05 DIAGNOSIS — N200 Calculus of kidney: Secondary | ICD-10-CM | POA: Diagnosis present

## 2011-10-05 MED ORDER — MAGNESIUM HYDROXIDE 400 MG/5ML PO SUSP: 30.0000 mL | Freq: Every day | ORAL | Status: DC | PRN

## 2011-10-05 MED ORDER — LAMOTRIGINE 25 MG PO TABS
50.0000 mg | ORAL_TABLET | Freq: Every day | ORAL | Status: DC
  Administered 2011-10-05: 50 mg via ORAL
  Filled 2011-10-05 (×3): qty 2

## 2011-10-05 MED ORDER — LAMOTRIGINE 100 MG PO TABS
100.0000 mg | ORAL_TABLET | Freq: Every evening | ORAL | Status: DC
  Filled 2011-10-05 (×2): qty 1

## 2011-10-05 MED ORDER — LAMOTRIGINE 25 MG PO TABS
50.0000 mg | ORAL_TABLET | Freq: Every evening | ORAL | Status: DC
  Filled 2011-10-05: qty 2

## 2011-10-05 MED ORDER — NAPROXEN 500 MG PO TABS
500.0000 mg | ORAL_TABLET | Freq: Two times a day (BID) | ORAL | Status: DC
  Administered 2011-10-05: 500 mg via ORAL
  Filled 2011-10-05 (×6): qty 1

## 2011-10-05 MED ORDER — NICOTINE 21 MG/24HR TD PT24
21.0000 mg | MEDICATED_PATCH | Freq: Every day | TRANSDERMAL | Status: DC
  Administered 2011-10-05 – 2011-10-07 (×3): 21 mg via TRANSDERMAL
  Filled 2011-10-05 (×5): qty 1

## 2011-10-05 MED ORDER — MIRTAZAPINE 15 MG PO TABS
15.0000 mg | ORAL_TABLET | Freq: Every day | ORAL | Status: DC
  Filled 2011-10-05: qty 1

## 2011-10-05 MED ORDER — GABAPENTIN 100 MG PO CAPS
200.0000 mg | ORAL_CAPSULE | ORAL | Status: DC
  Administered 2011-10-05 – 2011-10-06 (×2): 200 mg via ORAL
  Filled 2011-10-05 (×7): qty 2

## 2011-10-05 MED ORDER — TRAZODONE HCL 50 MG PO TABS: 50.0000 mg | ORAL_TABLET | Freq: Every evening | ORAL | Status: DC | PRN

## 2011-10-05 MED ORDER — LORAZEPAM 1 MG PO TABS
1.0000 mg | ORAL_TABLET | Freq: Three times a day (TID) | ORAL | Status: DC
  Administered 2011-10-05 (×3): 1 mg via ORAL
  Filled 2011-10-05 (×3): qty 1

## 2011-10-05 MED ORDER — ACETAMINOPHEN 325 MG PO TABS: 650.0000 mg | ORAL_TABLET | Freq: Four times a day (QID) | ORAL | Status: DC | PRN

## 2011-10-05 MED ORDER — BUPROPION HCL ER (XL) 150 MG PO TB24
150.0000 mg | ORAL_TABLET | Freq: Every day | ORAL | Status: DC
  Administered 2011-10-05: 150 mg via ORAL
  Filled 2011-10-05 (×4): qty 1

## 2011-10-05 MED ORDER — TRAZODONE HCL 100 MG PO TABS
100.0000 mg | ORAL_TABLET | Freq: Every day | ORAL | Status: DC
  Filled 2011-10-05: qty 1

## 2011-10-05 MED ORDER — AMITRIPTYLINE HCL 25 MG PO TABS
25.0000 mg | ORAL_TABLET | Freq: Every day | ORAL | Status: DC
  Filled 2011-10-05 (×2): qty 1

## 2011-10-05 MED ORDER — CITALOPRAM HYDROBROMIDE 20 MG PO TABS
20.0000 mg | ORAL_TABLET | Freq: Every day | ORAL | Status: DC
  Administered 2011-10-06 – 2011-10-07 (×2): 20 mg via ORAL
  Filled 2011-10-05 (×4): qty 1

## 2011-10-05 NOTE — Progress Notes (Signed)
[facility] Group Notes: (Counselor/Nursing/MHT/Case Management/Adjunct) 10/05/2011   @  1:15-2:30PM Finding Balance in Life  Type of Therapy:  Group Therapy  Participation Level:  Active  Participation Quality: Appropriate, Sharing, Supportive   Affect:  Appropriate  Cognitive:  Appropriate  Insight:  Good  Engagement in Group: Good  Engagement in Therapy:  Good  Modes of Intervention:  Support and Exploration  Summary of Progress/Problems: Leon Pena explored how he is uncomfortable with his life when it becomes balanced, because he is so used to it being unbalanced and chaotic. He shared about being raised with a mother who has paranoid schizophrenia, and how this led to him being comfortable with instability. Leon Pena processed how imbalance to him means that life is out of sync, and one has trouble coexisting with the world. He stated that this makes one vulnerable to being overwhelmed by the ups and downs of life. Leon Pena also explored finding balance when one deals with Bipolar Disorder. He talked about believing that there are some people who remain "on an even keel" through life's difficulties, including his ex-wife. He stated that he admired this about her, but living with him nearly threw her out of balance. He also shared about the tendency to live in "shoulds" and "what ifs" and told the group about CBT skills he has learned to deal with these kinds of thoughts.   [name] 10/05/2011   4:01 PM

## 2011-10-05 NOTE — Progress Notes (Signed)
Psychoeducational Group Note  Date:  10/05/2011 Time:  1000  Group Topic/Focus:  Therapeutic Activity- "Apples to Apples"  Participation Level:  Active  Participation Quality:  Appropriate and Attentive  Affect:  Appropriate  Cognitive:  Alert and Appropriate  Insight:  Good  Engagement in Group:  Good  Additional Comments:  Pt was appropriate and willing to participate in the game that was being played. Pt also shared that the game reminded him of how everyone perception is totally different. Pt was pleasant and calm throughout group.    Sharyn Lull 10/05/2011, 10:49 AM

## 2011-10-05 NOTE — Tx Team (Signed)
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-05-11                                    DISCHARGE CRITERIA:  Ability to meet basic life and health needs Adequate post-discharge living arrangements Improved stabilization in mood, thinking, and/or behavior Medical problems require only outpatient monitoring Motivation to continue treatment in a less acute level of care Need for constant or close observation no longer present Reduction of life-threatening or endangering symptoms to within safe limits  PRELIMINARY DISCHARGE PLAN: Attend aftercare/continuing care group Outpatient therapy  PATIENT/FAMIILY INVOLVEMENT: This treatment plan has been presented to and reviewed with the patient, Leon Pena, and/or family member.  The patient and family have been given the opportunity to ask questions and make suggestions.  [name] 10/05/2011, 1:08 AM

## 2011-10-05 NOTE — Progress Notes (Signed)
D: Pt is visible on the unit. Affect is blunted & mood sad & irritable. Trazedone was prescribed for sleep-- Pt stated that it gives him nightmares. Dr Allena Katz notified--& order was written for Remeron @ [facility]-- Pt was advised that order was changed. Pt. Stated that he cannot take remeron as it makes him flop like a chicken. Dr Allena Katz notified. A: Pt continues on 15 minute checks; supported & encouraged. R: Pt safety maintained.

## 2011-10-05 NOTE — Progress Notes (Signed)
[facility] Group Notes:  (Counselor/Nursing/MHT/Case Management/Adjunct)  10/05/2011 7:29 PM  Type of Therapy:  Recovery Goals: The focus of this group is to identify appropriate goals for recovery and establish a plan.  Participation Level:  Active  Participation Quality:  Appropriate  Affect:  Appropriate  Cognitive:  Appropriate  Insight:  Good  Engagement in Group:  Good  Engagement in Therapy:  Good  Modes of Intervention:  Education  Summary of Progress/Problems:Pt participated in recovery goals group. Pt defined what recovery is. Pt also was given a personal recovery goals worksheet. Pt worked on the worksheet and wrote what changes needed to be made and set a goal toward the change. Pt was asked to make specific, measurable goals that would be realistic to accomplish. Pt shared when asked questions during group.    Ardelle Park O 10/05/2011, 7:29 PM

## 2011-10-05 NOTE — [facility] Suicide Risk Assessment (Signed)
Suicide Risk Assessment  Admission Assessment     Demographic factors:  Assessment Details Time of Assessment: Admission Information Obtained From: Patient Current Mental Status:  Current Mental Status: Suicidal ideation indicated by patient Loss Factors:  Loss Factors: Decrease in vocational status;Legal issues;Financial problems / change in socioeconomic status Historical Factors:  Historical Factors: Prior suicide attempts Risk Reduction Factors:  Risk Reduction Factors: Sense of responsibility to family  CLINICAL FACTORS:   Severe Anxiety and/or Agitation Depression:   Anhedonia Hopelessness Severe More than one psychiatric diagnosis Previous Psychiatric Diagnoses and Treatments Medical Diagnoses and Treatments/Surgeries  COGNITIVE FEATURES THAT CONTRIBUTE TO RISK:  None Noted.   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  Generalized Anxiety Disorder.  The patient was seen today and reports the following:   ADL's: Intact.  Sleep: The patient reports to having significant difficulty initiating and maintaining sleep.  Appetite: The patient reports a poor appetite today.   Mild>(1-10) >Severe  Hopelessness (1-10): 10  Depression (1-10): 5  Anxiety (1-10): 10   Suicidal Ideation:  The patient denies any suicidal ideations today but reports suicidal ideations prior to admission.  Plan: No  Intent: No  Means: No   Homicidal Ideation: The patient denies any homicidal ideations today.  Plan: No  Intent: No.  Means: No   General Appearance/Behavior: The patient was friendly and cooperative today with this provider.  Eye Contact: Good.  Speech: Appropriate in rate and volume with no pressuring of speech noted today.  Motor Behavior: wnl.  Level of Consciousness: Alert and Oriented x 3.  Mental Status: Alert and Oriented x 3.  Mood: Moderately Depressed.  Affect: Mild to Moderately Constricted.    Anxiety Level: Severe anxiety reported today.  Thought Process: wnl.  Thought Content: The patient denies any auditory or visual hallucinations today as well as any delusional thinking.  Perception: wnl.   Judgment: Fair.  Insight: Fair.  Cognition: Oriented to person, place and time.   Current Medications: 1.  citalopram  20 mg Oral Daily 2.  gabapentin  200 mg Oral [id number] 3.  lamoTRIgine  50 mg Oral QHS 4.  nicotine  21 mg Transdermal Q0600 5.  Trazodone 100 mgs po qhs for sleep.   Review of Systems:  Neurological: No headaches, seizures or dizziness reported.  G.I.: The patient denies any constipation or stomach upset today.  Musculoskeletal: The patient denies any musculoskeletal issues today.   Time was spent today discussing with the patient his current symptoms. The patient states that he is having significant difficulty initiating and maintaining sleep.  He also reports a poor appetite.  The patient reports severe feelings of sadness, anhedonia and depressed mood.  He denies any current suicidal or homicidal ideations but states he was experiencing suicidal ideations prior to admission.  He denies any auditory or visual hallucinations or delusional thinking. The patient also reports severe anxiety symptoms as well as severe hopelessness.  Leon Pena states that he is unemployed and homeless and has been unable to afford his medications.  He presents for admission for evaluation and treatment of the above symptoms.  Treatment Plan Summary:  1. Daily contact with patient to assess and evaluate symptoms and progress in treatment.  2. Medication management  3. The patient will deny suicidal ideations or homicidal ideations for 48 hours prior to discharge and have a depression and anxiety rating of 3 or less. The patient will also deny any auditory  or visual hallucinations or delusional thinking.  4. The patient will deny any symptoms of substance withdrawal at time of discharge.    Plan:  1. The patient will be started on the medication Celexa 20 mgs po q am for depression and anxiety.  2. The patient will be started on the medication Neuronton at 200 mgs po q am, 2 pm and hs for anxiety and mood stabilization. 3. The patient was continued on the medication Lamictal which was decreased from 100 mgs po q 5 pm as started last night by the midlevel provider on call to 50 mgs po qhs.  This was continued to provide mood stabilization but decreased due to concerns of a [employer] reaction with a large starting dosage of Lamictal. 4. Laboratory Studies reviewed.  5. Will continue to monitor.   SUICIDE RISK:  Minimal: No identifiable suicidal ideation.  Patients presenting with no risk factors but with morbid ruminations; may be classified as minimal risk based on the severity of the depressive symptoms  Leon Pena 10/05/2011, 4:33 PM

## 2011-10-05 NOTE — Progress Notes (Addendum)
Patient seen during d/c planning group.  He advised of admitting to the hospital due to having depression and SI.  Patient advised he recently relocated to the area and has had difficulty accessing services. He reports being off meds for two months following loss of employment.  He also advised of being  Homeless.  Referral to be made to [facility] for assistance.  Patient will be referred to [facility] for follow up.  Referral made to Cathleen Fears, [facility].  Steward Drone to meet with patient later today.
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
## 2011-10-05 NOTE — Progress Notes (Signed)
Psychoeducational Group Note  Date:  10/05/2011 Time: 2000  Group Topic/Focus:  Wrap-Up Group:   The focus of this group is to help patients review their daily goal of treatment and discuss progress on daily workbooks.  Participation Level:  Active  Participation Quality:  Alert  Affect:    Cognitive:    Insight:    Engagement in Group:    Additional Comments:  Patient did attend AA meeting tonight.  Leon Pena, Leon Pena 10/05/2011, 8:48 PM

## 2011-10-06 MED ORDER — CARBAMAZEPINE 200 MG PO TABS: 200.0000 mg | ORAL_TABLET | Freq: Three times a day (TID) | ORAL | Status: DC

## 2011-10-06 MED ORDER — CARBAMAZEPINE 200 MG PO TABS
200.0000 mg | ORAL_TABLET | Freq: Three times a day (TID) | ORAL | Status: DC
  Administered 2011-10-06 – 2011-10-07 (×4): 200 mg via ORAL
  Filled 2011-10-06 (×7): qty 1

## 2011-10-06 NOTE — Progress Notes (Signed)
[facility] MD Progress Note  10/06/2011 11:00 AM  Diagnosis:   Axis I: Generalized Anxiety Disorder, Major Depression, Recurrent severe and Rule out Chronic Traumatic Encephalopathy Axis II: Deferred Axis III:  Past Medical History  Diagnosis Date  . Hypertension   . Depression    Axis IV: other psychosocial or environmental problems Axis V: 41-50 serious symptoms  ADL's:  Intact  Sleep: Poor  Appetite:  Fair  Suicidal Ideation:  Denies suicidal thoughts today. Homicidal Ideation:  Pt denies any thoughts, plans, intent of homicide  AEB (as evidenced by): per pt report  Mental Status Examination/Evaluation: Objective:  Appearance: Casual  Eye Contact::  Good  Speech:  Clear and Coherent  Volume:  Normal  Mood:  Anxious, Depressed and Irritable  Affect:  Congruent  Thought Process:  Coherent  Orientation:  Full  Thought Content:  WDL  Suicidal Thoughts:  No  Homicidal Thoughts:  No  Memory:  Immediate;   Fair Recent;   Fair Remote;   Fair  Judgement:  Fair  Insight:  Fair  Psychomotor Activity:  Normal  Concentration:  Fair  Recall:  Fair  Akathisia:  No  Handed:  Right  AIMS (if indicated):     Assets:  Communication Skills Desire for Improvement  Sleep:  Number of Hours: 6.5    ROS: Neuro: no headaches, ataxia, weakness  GI: no N/V/D/cramps/constipation  MS: no weakness, muscle cramps, aches.  Vital Signs:Blood pressure 124/83, pulse 83, temperature 97.6 F (36.4 C), temperature source Oral, resp. rate 16, height 6\' 3"  (1.905 m), weight 116.574 kg (257 lb). Current Medications: Current Facility-Administered Medications  Medication Dose Route Frequency Provider Last Rate Last Dose  . acetaminophen (TYLENOL) tablet 650 mg  650 mg Oral Q6H PRN Kerry Hough, PA      . carbamazepine (TEGRETOL) tablet 200 mg  200 mg Oral TID Mike Craze, MD      . citalopram (CELEXA) tablet 20 mg  20 mg Oral Daily Curlene Labrum Readling, MD   20 mg at 10/06/11 0753  .  magnesium hydroxide (MILK OF MAGNESIA) suspension 30 mL  30 mL Oral Daily PRN Kerry Hough, PA      . nicotine (NICODERM CQ - dosed in mg/24 hours) patch 21 mg  21 mg Transdermal Q0600 Curlene Labrum Readling, MD   21 mg at 10/06/11 0615  . DISCONTD: amitriptyline (ELAVIL) tablet 25 mg  25 mg Oral QHS Kerry Hough, PA      . DISCONTD: buPROPion (WELLBUTRIN XL) 24 hr tablet 150 mg  150 mg Oral Daily Kerry Hough, PA   150 mg at 10/05/11 0811  . DISCONTD: carbamazepine (TEGRETOL) tablet 200 mg  200 mg Oral TID Mike Craze, MD      . DISCONTD: gabapentin (NEURONTIN) capsule 200 mg  200 mg Oral [id number] Curlene Labrum Readling, MD   200 mg at 10/06/11 0753  . DISCONTD: lamoTRIgine (LAMICTAL) tablet 100 mg  100 mg Oral QPM Kerry Hough, PA      . DISCONTD: lamoTRIgine (LAMICTAL) tablet 50 mg  50 mg Oral QPM Curlene Labrum Readling, MD      . DISCONTD: lamoTRIgine (LAMICTAL) tablet 50 mg  50 mg Oral QHS Curlene Labrum Readling, MD   50 mg at 10/05/11 2113  . DISCONTD: LORazepam (ATIVAN) tablet 1 mg  1 mg Oral Q8H Kerry Hough, PA   1 mg at 10/05/11 1327  . DISCONTD: mirtazapine (REMERON) tablet 15 mg  15 mg Oral QHS  Ronny Bacon, MD      . DISCONTD: naproxen (NAPROSYN) tablet 500 mg  500 mg Oral BID Kerry Hough, PA   500 mg at 10/05/11 0810  . DISCONTD: traZODone (DESYREL) tablet 100 mg  100 mg Oral QHS Ronny Bacon, MD        Lab Results:  Results for orders placed during the hospital encounter of 10/04/11 (from the past 48 hour(s))  TSH     Status: Normal   Collection Time   10/05/11  6:25 AM      Component Value Range Comment   TSH 4.314  0.350 - 4.500 uIU/mL   T4, FREE     Status: Normal   Collection Time   10/05/11  6:25 AM      Component Value Range Comment   Free T4 1.44  0.80 - 1.80 ng/dL     Physical Findings: AIMS: Facial and Oral Movements Muscles of Facial Expression: None, normal Lips and Perioral Area: None, normal Jaw: None, normal Tongue: None, normal,Extremity  Movements Upper (arms, wrists, hands, fingers): None, normal Lower (legs, knees, ankles, toes): None, normal, Trunk Movements Neck, shoulders, hips: None, normal, Overall Severity Severity of abnormal movements (highest score from questions above): None, normal Incapacitation due to abnormal movements: None, normal Patient's awareness of abnormal movements (rate only patient's report): No Awareness, Dental Status Current problems with teeth and/or dentures?: No Does patient usually wear dentures?: No  CIWA:    COWS:     Treatment Plan Summary: Daily contact with patient to assess and evaluate symptoms and progress in treatment Medication management Mood/anxiety less than 3/10 where the scale is 1 is the best and 10 is the worst No suicidal or homicidal thoughts for at least 48 hours.  Plan: Start tegretol for anger control, mood control, and anxiety management. See how it works.  He had requested D/C today.  He was invited to stay and try the Tegretol.  He took the invitation.  Leon Pena 10/06/2011, 11:00 AM

## 2011-10-06 NOTE — Progress Notes (Signed)
Patient seen during during d/c planning group and or treatment team. He reported being better and requested to discharge today.  He denies SI/HI and rates depression at six, anxiety at seven and hopelessness/helplessness at eight.  Patient shared that he is willing to stay in hospital for MD to make changes to medications.  He requested a referral to vocational rehab.  Referral made to [facility] RN who met with patient on 10/05/11 and made patient aware of services available through there program.

## 2011-10-06 NOTE — Progress Notes (Signed)
  D) Patient pleasant and cooperative upon my assessment. Patient states slept "poor," and  appetite is " good." Patient rates depression as   7/10, patient rates hopeless feelings as  6/10. Patient denies SI/HI, denies A/V hallucinations.   A) Patient offered support and encouragement, patient encouraged to discuss feelings/concerns with staff. Patient verbalized understanding. Patient monitored Q15 minutes for safety. Patient met with MD and treatment team to discuss today's goals and plan of care.  R) Patient active on unit, attending groups in day room and meals in dining room.  Patient is concerned about his ability to afford medications once he is discharged from [facility], patient is currently unemployed. MD attempting to order medications that are effective and more affordable. Patient verbalizes understanding of medication changes. Patient taking medications as ordered. Will continue to monitor.

## 2011-10-06 NOTE — Progress Notes (Signed)
[facility] Group Notes:  (Counselor/Nursing/MHT/Case Management/Adjunct)  10/06/2011 12:02 PM  Type of Therapy:  Psychoeducational Skills  Participation Level:  Active  Participation Quality:  Appropriate, Attentive, Sharing and Supportive  Affect:  Appropriate  Cognitive:  Alert, Appropriate and Oriented  Insight:  Good  Engagement in Group:  Good  Engagement in Therapy:  n/a  Modes of Intervention:  Activity, Education, Problem-solving, Socialization and Support  Summary of Progress/Problems:  Eldin attended Psychoeducational group that focused on using quality time with support systems/individuals to engage in healthy coping skills. Tremain participated in activity guessing about self and peers. Cher was active while group discussed who their supports are, how they can spend positive quality time with them as a coping skill and a way to strengthen their relationship. Khiry was given a homework assignment to find two ways to improve his support systems and 20 activities he can do to spend quality time with supports.   Wandra Scot 10/06/2011, 12:02 PM

## 2011-10-06 NOTE — Progress Notes (Signed)
Psychoeducational Group Note  Date:  10/06/2011 Time: 1100    Group Topic/Focus:  Goals Group:   The focus of this group is to help patients establish daily goals to achieve during treatment and discuss how the patient can incorporate goal setting into their daily lives to aide in recovery.  Participation Level:  Active  Participation Quality:  Appropriate  Affect:  Appropriate  Cognitive:  Alert  Insight:  Good  Engagement in Group:  Good  Additional Comments:  Pt was interested in the topic of group today. Pt commented in regards to what values meant to him.  Lenox Ahr 10/06/2011, 2:01 PM

## 2011-10-06 NOTE — Progress Notes (Signed)
Patient resting quietly with eyes closed. Respirations even and unlabored. No distress noted. Q 15 minute check continues as ordered for safety. 

## 2011-10-06 NOTE — Progress Notes (Signed)
Psychoeducational Group Note  Date:  10/06/2011 Time:  2000  Group Topic/Focus:  Wrap-Up Group:   The focus of this group is to help patients review their daily goal of treatment and discuss progress on daily workbooks.  Participation Level:  Active  Participation Quality:  Appropriate  Affect:  Appropriate  Cognitive:  Alert  Insight:  Good  Engagement in Group:  Good  Additional Comments:  Pt is in good spirits this evening. Pt is however concerned about going back to the place that he was once living before admission.  [name] 10/06/2011, 10:14 PM

## 2011-10-06 NOTE — Progress Notes (Signed)
[facility] Group Notes: (Counselor/Nursing/MHT/Case Management/Adjunct) 10/06/2011   1:15-2:30pm Emotion Regulation  Type of Therapy:  Group Therapy  Participation Level:  Active  Participation Quality: Appropriate, Sharing, Supportive   Affect:  Appropriate  Cognitive:  Appropriate  Insight:  Good  Engagement in Group: Good  Engagement in Therapy:  Good  Modes of Intervention:  Support and Exploration  Summary of Progress/Problems: Leon Pena explored his overwhelming emotions, stating that keeping one's feelings under control is harder than it seems. He processed ways that his anger and depression have resulted in him losing jobs and relationships when he acted on his action urges. Leon Pena shared about the hate he harbored for someone who had hurt him in the past, and how he used methods such as writing the person a letter to move past that emotion. He went on to support and encourage others to let go of emotional suffering and gave positive feedback to those looking for help in doing so. Leon Pena stated that going to groups has been very helpful to him, and provided information about finding group therapy to another group member.  Angus Palms, LCSW 10/06/2011  2:53 PM

## 2011-10-06 NOTE — Progress Notes (Signed)
Patient ID: Leon Pena, male   DOB: 24-May-1965, 46 y.o.   MRN: [id number] D: Pt. Affect brighter, and notes being back on meds has helped. Pt. Notes that he'll be discharged tomorrow but feeling apprehensive about finding and job and living arrangements. 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  "I wasn't off my meds intentionally I didn't have any money." A: Pt. Encouraged to be positive and use all the resources he can in finding a job. Pt. Notes that he will be given samples upon discharge which will be helpful. Staff will monitor q50min for safety. Pt. Encouraged to go to groups. R: Pt. Has already begun calling places looking for job. Pt. Remains safe on the unit. Pt. Attends group and actively participates.

## 2011-10-07 MED ORDER — CARBAMAZEPINE 200 MG PO TABS: 200.0000 mg | ORAL_TABLET | Freq: Three times a day (TID) | ORAL | Status: DC

## 2011-10-07 MED ORDER — CITALOPRAM HYDROBROMIDE 20 MG PO TABS: 20.0000 mg | ORAL_TABLET | Freq: Every day | ORAL | Status: DC

## 2011-10-07 MED ORDER — NICOTINE 21 MG/24HR TD PT24: 1.0000 | MEDICATED_PATCH | Freq: Every day | TRANSDERMAL | Status: AC

## 2011-10-07 NOTE — Tx Team (Signed)
Interdisciplinary Treatment Plan Update (Adult)  Date:  10/07/2011  Time Reviewed:  10:39 AM   Progress in Treatment: Attending groups: Yes Participating in groups:  Yes Taking medication as prescribed:  Yes Tolerating medication: Yes Family/Significant othe contact made:  No, [name] refused to allow contact Patient understands diagnosis: Yes Discussing patient identified problems/goals with staff:  Yes Medical problems stabilized or resolved: Yes Denies suicidal/homicidal ideation: Yes Issues/concerns per patient self-inventory:  No  Other:  New problem(s) identified: None  Reason for Continuation of Hospitalization: Appropriate for discharge today  Interventions implemented related to continuation of hospitalization:  Medication stabilization, safety checks q 15 mins, group attendance  Additional comments: [name] has not met goals for depression and anxiety, but he reports that the depression is his baseline, and the anxiety is due to whether he will be able to discharge today.  Estimated length of stay: discharge today  Discharge Plan: Discharge on his own - [name] reports that he would prefer to return to live in his tent. He has declined the options given to him in terms of housing. He will follow up with [facility] and will work with congregational nurse, Cathleen Fears  New goal(s):  Review of initial/current patient goals per problem list:   1.  Goal(s):Decrease symptoms of depression to 4 or less  Met:  No  Target date: by discharge  As evidenced by: [name] rates depression at 5  2.  Goal (s): Decrease symptoms of anxiety to 4 or less  Met:  No  Target date: by discharge  As evidenced by: [name] rates his anxiety at 8 today  3.  Goal(s): Reduce potential for suicide/self-harm  Met:  Yes  Target date:by discharge  As evidenced by: [name] denies any suicidal thoughts  4.  Goal(s): Stabilize medical problems   Met:  DEFERRED  Target date:   DEFERRED  As evidenced by: Medical issues not addressed in psychiatric hospital, but stable enough to be safe at discharge  Attendees: Patient:  Leon Pena 10/07/2011 10:39 AM  Family:     Physician:  Dr Orson Aloe, MD 10/07/2011 10:39 AM  Nursing:   Quintella Reichert, RN 10/07/2011 10:39 AM  Case Manager:  Juline Patch, LCSW 10/07/2011 10:39 AM  Counselor:  Angus Palms, LCSW 10/07/2011 10:39 AM  Other:  Reyes Ivan, LCSWA 10/07/2011 10:39 AM  Other:  Pixie Casino, RN 10/07/2011 10:39 AM  Other:  Gillis Ends, MSW Intern 10/07/2011 10:39 AM  Other:      Scribe for Treatment Team:   [name], 10/07/2011 10:39 AM

## 2011-10-07 NOTE — Progress Notes (Addendum)
Discharge Note:   Patient has bus pass to use after discharge.   Stated he has his own tent in [place] that he is returning to.  Patient has reviewed his discharge instructions and has no questions.  Patient has received all his discharge instructions and belongings, sneakers, insurance card, wallet, $10, ID.  Patient stated he appreciated all the staff has done to assist him while at [facility]   Denied SI & HI.   Denied A/V hallucinations.   Denied pain.

## 2011-10-07 NOTE — [facility] Suicide Risk Assessment (Signed)
Suicide Risk Assessment  Discharge Assessment     Demographic factors:  Male;Caucasian;Low socioeconomic status;Unemployed    Current Mental Status Per Nursing Assessment::   On Admission:  Suicidal ideation indicated by patient At Discharge:     Current Mental Status Per Physician: Patient denies suicidal or homicidal ideation, hallucinations, illusions, or delusions. Patient engages with good eye contact, is able to focus adequately in a one to one setting, and has clear goal directed thoughts. Patient speaks with a natural conversational volume, rate, and tone. Anxiety was reported at 3 on a scale of 1 the least and 10 the most. Depression was reported at 3 on the same scale. Patient is oriented times 4, recent and remote memory intact. Judgement: limited by his mental illness Insight: also limited by his mental illness  Loss Factors: Decrease in vocational status;Legal issues;Financial problems / change in socioeconomic status  Historical Factors: Prior suicide attempts  Continued Clinical Symptoms:  Severe Anxiety and/or Agitation Bipolar Disorder:   Bipolar II Previous Psychiatric Diagnoses and Treatments  Discharge Diagnoses:   AXIS I:  Generalized Anxiety Disorder, Major Depression, Recurrent severe and rule out Chronic Traumatic Encephalopathy (emotions made worse by head injurIES) AXIS II:  Deferred AXIS III:   Past Medical History  Diagnosis Date  . Hypertension   . Depression    AXIS IV:  economic problems, housing problems, occupational problems and other psychosocial or environmental problems AXIS V:  51-60 moderate symptoms  Cognitive Features That Contribute To Risk:  Closed-mindedness Thought constriction (tunnel vision)    Suicide Risk:  Minimal: No identifiable suicidal ideation.  Patients presenting with no risk factors but with morbid ruminations; may be classified as minimal risk based on the severity of the depressive symptoms  Results for  orders placed during the hospital encounter of 10/04/11 (from the past 72 hour(s))  TSH     Status: Normal   Collection Time   10/05/11  6:25 AM      Component Value Range Comment   TSH 4.314  0.350 - 4.500 uIU/mL   T4, FREE     Status: Normal   Collection Time   10/05/11  6:25 AM      Component Value Range Comment   Free T4 1.44  0.80 - 1.80 ng/dL   CARBAMAZEPINE LEVEL, TOTAL     Status: Abnormal   Collection Time   10/07/11  6:26 AM      Component Value Range Comment   Carbamazepine Lvl 3.3 (*) 4.0 - 12.0 ug/mL     RISK REDUCTION FACTORS: What pt has learned from hospital stay is that his medication problem has been a problem for him and he needs to do something different.  Risk of self harm is elevated by his prior attempts, his depression, his anxiety, his chronic medical condition.  Risk of harm to others is elevated by his anger problems, he has most jobs over his low frustration tolerance   Pt seen in treatment team where she divulged the above information. The treatment team concluded that they were ready for discharge and had met their goals for an inpatient setting.   PLAN: Discharge home Continue Medication List  As of 10/07/2011 12:02 PM   STOP taking these medications         buPROPion 150 MG 24 hr tablet      lamoTRIgine 25 MG tablet      LORazepam 1 MG tablet      naproxen 500 MG tablet  TAKE these medications      Indication    carbamazepine 200 MG tablet   Commonly known as: TEGRETOL   Take 1 tablet (200 mg total) by mouth 3 (three) times daily. For mood control.       citalopram 20 MG tablet   Commonly known as: CELEXA   Take 1 tablet (20 mg total) by mouth daily. For depression.       nicotine 21 mg/24hr patch   Commonly known as: NICODERM CQ - dosed in mg/24 hours   Place 1 patch onto the skin daily at 6 (six) AM. For smoking cessation.            Follow-up recommendations:  Activities: Resume typical activities Diet: Resume  typical diet Tests: need to follow Tegretol level Other: Follow up with outpatient provider and report any side effects to out patient prescriber.  Leon Pena 10/07/2011, 11:52 AM
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
## 2011-10-07 NOTE — Progress Notes (Signed)
Patient's self inventory sheet, patient needs sleep medications, has improving appetite, has normal energy level, improving attention span.  Rated depression and hopelessness #7.  Denied withdrawals.   Denied SI.   Denied pain.  After discharge, plans to take meds and attend out patient therapy.  Thanked staff for their assistance while at [facility].   Does have discharge plans.   Does not have money to buy his medications after discharge. Patient denied SI and HI today.   Denied A/V hallucinations.   Denied pain.   Attended discharge planning group this morning.  Has been cooperative and pleasant and is excited about discharge today.

## 2011-10-08 NOTE — Progress Notes (Signed)
Patient Discharge Instructions:  After Visit Summary (AVS):   Faxed to:  10/08/2011 Psychiatric Admission Assessment Note:   Faxed to:  10/08/2011 Suicide Risk Assessment - Discharge Assessment:   Faxed to:  10/08/2011 Faxed/Sent to the Next Level Care provider:  10/08/2011  Faxed to [facility] @ [phone]  Heloise Purpura Eduard Clos, 10/08/2011, 3:36 PM

## 2011-10-13 NOTE — Progress Notes (Signed)
Patient Discharge Instructions:  After Visit Summary (AVS):   Faxed to:  10/13/2011 Psychiatric Admission Assessment Note:   Faxed to:  10/13/2011 Suicide Risk Assessment - Discharge Assessment:   Faxed to:  10/13/2011 Faxed/Sent to the Next Level Care provider:  10/13/2011  Faxed to [facility] @ [phone]  Wandra Scot, 10/13/2011, 6:00 PM

## 2012-06-08 ENCOUNTER — Emergency Department (HOSPITAL_COMMUNITY): Payer: Self-pay

## 2012-06-08 ENCOUNTER — Emergency Department (HOSPITAL_COMMUNITY)
Admission: EM | Admit: 2012-06-08 | Discharge: 2012-06-08 | Disposition: A | Discharge status: Home / Self Care | Payer: Self-pay | Attending: Emergency Medicine | Admitting: Emergency Medicine

## 2012-06-08 ENCOUNTER — Encounter (HOSPITAL_COMMUNITY): Payer: Self-pay | Admitting: Emergency Medicine

## 2012-06-08 DIAGNOSIS — N200 Calculus of kidney: Secondary | ICD-10-CM | POA: Insufficient documentation

## 2012-06-08 DIAGNOSIS — F3289 Other specified depressive episodes: Secondary | ICD-10-CM | POA: Insufficient documentation

## 2012-06-08 DIAGNOSIS — R112 Nausea with vomiting, unspecified: Secondary | ICD-10-CM | POA: Insufficient documentation

## 2012-06-08 DIAGNOSIS — F329 Major depressive disorder, single episode, unspecified: Secondary | ICD-10-CM | POA: Insufficient documentation

## 2012-06-08 DIAGNOSIS — Z79899 Other long term (current) drug therapy: Secondary | ICD-10-CM | POA: Insufficient documentation

## 2012-06-08 DIAGNOSIS — N23 Unspecified renal colic: Secondary | ICD-10-CM | POA: Insufficient documentation

## 2012-06-08 DIAGNOSIS — I10 Essential (primary) hypertension: Secondary | ICD-10-CM | POA: Insufficient documentation

## 2012-06-08 DIAGNOSIS — R509 Fever, unspecified: Secondary | ICD-10-CM | POA: Insufficient documentation

## 2012-06-08 LAB — COMPREHENSIVE METABOLIC PANEL
ALT: 27 U/L (ref 0–53)
Alkaline Phosphatase: 74 U/L (ref 39–117)
CO2: 21 mEq/L (ref 19–32)
Calcium: 9.1 mg/dL (ref 8.4–10.5)
GFR calc Af Amer: >90 mL/min (ref >90)
GFR calc non Af Amer: >90 mL/min (ref >90)
Glucose, Bld: 117 mg/dL — ABNORMAL HIGH (ref 70–99)
Sodium: 135 mEq/L (ref 135–145)
Total Bilirubin: 0.4 mg/dL (ref 0.3–1.2)

## 2012-06-08 LAB — URINALYSIS, ROUTINE W REFLEX MICROSCOPIC
Glucose, UA: NEGATIVE mg/dL
Specific Gravity, Urine: 1.024 (ref 1.005–1.030)
Urobilinogen, UA: 0.2 mg/dL (ref 0.0–1.0)
pH: 6 (ref 5.0–8.0)

## 2012-06-08 LAB — CBC WITH DIFFERENTIAL/PLATELET
Eosinophils Relative: 4 % (ref 0–5)
HCT: 46.6 % (ref 39.0–52.0)
Lymphocytes Relative: 20 % (ref 12–46)
Lymphs Abs: 1.8 10*3/uL (ref 0.7–4.0)
MCV: 86.6 fL (ref 78.0–100.0)
Platelets: 241 10*3/uL (ref 150–400)
RBC: 5.38 MIL/uL (ref 4.22–5.81)
WBC: 9.2 10*3/uL (ref 4.0–10.5)

## 2012-06-08 MED ORDER — HYDROMORPHONE HCL PF 1 MG/ML IJ SOLN
1.0000 mg | Freq: Once | INTRAMUSCULAR | Status: AC
  Administered 2012-06-08: 1 mg via INTRAVENOUS
  Filled 2012-06-08: qty 1

## 2012-06-08 MED ORDER — ONDANSETRON HCL 4 MG/2ML IJ SOLN
4.0000 mg | Freq: Once | INTRAMUSCULAR | Status: AC
  Administered 2012-06-08: 4 mg via INTRAVENOUS
  Filled 2012-06-08: qty 2

## 2012-06-08 MED ORDER — ACETAMINOPHEN 10 MG/ML IV SOLN
1000.0000 mg | Freq: Once | INTRAVENOUS | Status: AC
  Administered 2012-06-08: 1000 mg via INTRAVENOUS
  Filled 2012-06-08: qty 100

## 2012-06-08 MED ORDER — SODIUM CHLORIDE 0.9 % IV SOLN
INTRAVENOUS | Status: DC
  Administered 2012-06-08: 09:00:00 via INTRAVENOUS

## 2012-06-08 MED ORDER — ONDANSETRON HCL 8 MG PO TABS: 8.0000 mg | ORAL_TABLET | Freq: Three times a day (TID) | ORAL | Status: DC | PRN

## 2012-06-08 MED ORDER — OXYCODONE-ACETAMINOPHEN 5-325 MG PO TABS: 1.0000 | ORAL_TABLET | Freq: Four times a day (QID) | ORAL | Status: DC | PRN

## 2012-06-08 NOTE — Progress Notes (Signed)
ED RN spok e with ED CM about pt his concern of going to specialist recommended by EDP and him not having insurance coverage CM recommended ED RN speak with pt about him calling for his appointment with the specialist and informing them at that time that he does not have insurance coverage and speaking to the office's billing office about payment plans Cm noted EDP has not put this appointment on the pt's d/c instructions at this time CM request RN make sure EDP or RN puts this information on the pt's d/c instructions
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
## 2012-06-08 NOTE — ED Notes (Signed)
Pt c/o of flank pain that started last night. States that he only has one kidney. Also c/o of nausea vomiting fever. NAD at this time.

## 2012-06-08 NOTE — ED Provider Notes (Signed)
History     CSN: [id number]  Arrival date & time 06/08/12  0815   First MD Initiated Contact with Patient 06/08/12 [phone]      Chief Complaint  Patient presents with  . Flank Pain  . Nausea  . Emesis    (Consider location/radiation/quality/duration/timing/severity/associated sxs/prior treatment) Patient is a 47 y.o. male presenting with flank pain and vomiting. The history is provided by the patient.  Flank Pain Pertinent negatives include no chest pain, no abdominal pain, no headaches and no shortness of breath.  Emesis Associated symptoms: no abdominal pain, no headaches and no sore throat   pt c/o left flank pain posteriorly, onset last evening. Constant. Dull, mod-severe, waxes and wanes in intensity. No radiation. No anterior/abd pain, no scrotal or testicular pain. No dysuria or hematuria. Hx solitary left kidney (notes multiple kidney surgeries as child w eventual right nephrectomy approximately 15 yrs ago). Hx kidney stones and prior uti. Subjective fever at home. No chills/sweats.     Past Medical History  Diagnosis Date  . Hypertension   . Depression     Past Surgical History  Procedure Laterality Date  . Kidney surgery    . Cholecystectomy      No family history on file.  History  Substance Use Topics  . Smoking status: Never Smoker   . Smokeless tobacco: Current User    Types: Snuff  . Alcohol Use: No      Review of Systems  Constitutional: Positive for fever.  HENT: Negative for sore throat and neck pain.   Eyes: Negative for redness.  Respiratory: Negative for cough and shortness of breath.   Cardiovascular: Negative for chest pain.  Gastrointestinal: Positive for nausea and vomiting. Negative for abdominal pain.  Genitourinary: Positive for flank pain.  Musculoskeletal: Negative for back pain.  Skin: Negative for rash.  Neurological: Negative for headaches.  Hematological: Does not bruise/bleed easily.  Psychiatric/Behavioral: Negative for  confusion.    Allergies  Latex; Compazine; and Promethazine hcl  Home Medications   Current Outpatient Rx  Name  Route  Sig  Dispense  Refill  . carbamazepine (TEGRETOL) 200 MG tablet   Oral   Take 1 tablet (200 mg total) by mouth 3 (three) times daily. For mood control.   90 tablet   0   . citalopram (CELEXA) 20 MG tablet   Oral   Take 1 tablet (20 mg total) by mouth daily. For depression.   30 tablet   0     BP 116/72  Pulse 69  Temp(Src) 97.8 F (36.6 C) (Oral)  Resp 18  SpO2 97%  Physical Exam  Nursing note and vitals reviewed. Constitutional: He is oriented to person, place, and time. He appears well-developed and well-nourished. No distress.  HENT:  Nose: Nose normal.  Mouth/Throat: Oropharynx is clear and moist.  Eyes: Pupils are equal, round, and reactive to light.  Neck: Neck supple. No tracheal deviation present.  Cardiovascular: Normal rate, regular rhythm, normal heart sounds and intact distal pulses.   Pulmonary/Chest: Effort normal and breath sounds normal. No accessory muscle usage. No respiratory distress.  Abdominal: Soft. Bowel sounds are normal. He exhibits no distension and no mass. There is no tenderness. There is no rebound and no guarding.  Genitourinary:  Mild left cva tenderness.   Musculoskeletal: Normal range of motion. He exhibits no edema.  Neurological: He is alert and oriented to person, place, and time.  Skin: Skin is warm and dry. No rash noted.  Psychiatric: He  has a normal mood and affect.    ED Course  Procedures (including critical care time)  Results for orders placed during the hospital encounter of 06/08/12  URINALYSIS, ROUTINE W REFLEX MICROSCOPIC      Result Value Range   Color, Urine YELLOW  YELLOW   APPearance CLOUDY (*) CLEAR   Specific Gravity, Urine 1.024  1.005 - 1.030   pH 6.0  5.0 - 8.0   Glucose, UA NEGATIVE  NEGATIVE mg/dL   Hgb urine dipstick LARGE (*) NEGATIVE   Bilirubin Urine NEGATIVE  NEGATIVE    Ketones, ur NEGATIVE  NEGATIVE mg/dL   Protein, ur NEGATIVE  NEGATIVE mg/dL   Urobilinogen, UA 0.2  0.0 - 1.0 mg/dL   Nitrite NEGATIVE  NEGATIVE   Leukocytes, UA NEGATIVE  NEGATIVE  CBC WITH DIFFERENTIAL      Result Value Range   WBC 9.2  4.0 - 10.5 K/uL   RBC 5.38  4.22 - 5.81 MIL/uL   Hemoglobin 16.6  13.0 - 17.0 g/dL   HCT 40.9  81.1 - 91.4 %   MCV 86.6  78.0 - 100.0 fL   MCH 30.9  26.0 - 34.0 pg   MCHC 35.6  30.0 - 36.0 g/dL   RDW 78.2  95.6 - 21.3 %   Platelets 241  150 - 400 K/uL   Neutrophils Relative 66  43 - 77 %   Neutro Abs 6.1  1.7 - 7.7 K/uL   Lymphocytes Relative 20  12 - 46 %   Lymphs Abs 1.8  0.7 - 4.0 K/uL   Monocytes Relative 9  3 - 12 %   Monocytes Absolute 0.9  0.1 - 1.0 K/uL   Eosinophils Relative 4  0 - 5 %   Eosinophils Absolute 0.4  0.0 - 0.7 K/uL   Basophils Relative 1  0 - 1 %   Basophils Absolute 0.1  0.0 - 0.1 K/uL  COMPREHENSIVE METABOLIC PANEL      Result Value Range   Sodium 135  135 - 145 mEq/L   Potassium 4.1  3.5 - 5.1 mEq/L   Chloride 103  96 - 112 mEq/L   CO2 21  19 - 32 mEq/L   Glucose, Bld 117 (*) 70 - 99 mg/dL   BUN 9  6 - 23 mg/dL   Creatinine, Ser 0.86  0.50 - 1.35 mg/dL   Calcium 9.1  8.4 - 57.8 mg/dL   Total Protein 6.9  6.0 - 8.3 g/dL   Albumin 3.9  3.5 - 5.2 g/dL   AST 21  0 - 37 U/L   ALT 27  0 - 53 U/L   Alkaline Phosphatase 74  39 - 117 U/L   Total Bilirubin 0.4  0.3 - 1.2 mg/dL   GFR calc non Af Amer >90  >90 mL/min   GFR calc Af Amer >90  >90 mL/min  URINE MICROSCOPIC-ADD ON      Result Value Range   Squamous Epithelial / LPF RARE  RARE   RBC / HPF 11-20  <3 RBC/hpf   Bacteria, UA RARE  RARE   Ct Abdomen Pelvis Wo Contrast  06/08/2012  *RADIOLOGY REPORT*  Clinical Data: Left flank pain.  Right nephrectomy  CT ABDOMEN AND PELVIS WITHOUT CONTRAST  Technique:  Multidetector CT imaging of the abdomen and pelvis was performed following the standard protocol without intravenous contrast.  Comparison: CT 06/29/2011,  02/19/2003  Findings:  Renal:  There is a 5 mm calculus at the left vesicoureteral junction  which is new from prior.  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  There multiple additional calculi within the left kidney including a cluster of 3 to 5 mm stones in the lower pole, 1 mm in the upper pole and a 3 mm calculus in the interpole.  No distal ureteral calculi on the left distal to the UPJ stone. The ureteral pelvic junction stone on the left is faintly visible on the CT scout.  Prior right nephrectomy.  Lung bases are clear.  No focal hepatic lesion on this noncontrast exam.  Post cholecystectomy.  The pancreas, spleen, and adrenal glands are normal.  The stomach, small bowel, colon unremarkable.  Abdominal aorta normal caliber.  No retroperitoneal or periportal lymphadenopathy.  No free fluid the pelvis.  Prostate gland bladder normal.  No pelvic lymphadenopathy. Review of  bone windows demonstrates no aggressive osseous lesions.  IMPRESSION:  1.  New calculus at the left ureteral pelvic junction. 2.  Patulous left renal collecting system is similar to comparison exam and consistent with chronic UPJ obstruction. 3.  Multiple additional calculi within the left kidney. 4.  Stable right nephrectomy bed.   Original Report Authenticated By: Genevive Bi, M.D.        MDM  Iv ns. Dilaudid 1 mg iv. zofran iv.  Reviewed nursing notes and prior charts for additional history.   Recheck pain persists. Recheck abd soft nt.  Discussed pt with Dr Vernie Ammons, including solitary kidney, labs incl ua/cr, ct - pain post iv dilaudid.  He requests we give acetaminophen 1 gm iv, avoid nsaids, d/c to home and have f/u tomorrow.   Recheck pain improved.  Pt stable for d/c. Discussed plan for urology f/u tomorrow.         Suzi Roots, MD 06/08/12 [phone]

## 2012-11-29 ENCOUNTER — Emergency Department (HOSPITAL_COMMUNITY)
Admission: EM | Admit: 2012-11-29 | Discharge: 2012-11-29 | Disposition: A | Discharge status: Home / Self Care | Payer: Self-pay | Attending: Emergency Medicine | Admitting: Emergency Medicine

## 2012-11-29 ENCOUNTER — Encounter (HOSPITAL_COMMUNITY): Payer: Self-pay | Admitting: Emergency Medicine

## 2012-11-29 ENCOUNTER — Emergency Department (HOSPITAL_COMMUNITY): Payer: Self-pay

## 2012-11-29 DIAGNOSIS — F3289 Other specified depressive episodes: Secondary | ICD-10-CM | POA: Insufficient documentation

## 2012-11-29 DIAGNOSIS — Z862 Personal history of diseases of the blood and blood-forming organs and certain disorders involving the immune mechanism: Secondary | ICD-10-CM | POA: Insufficient documentation

## 2012-11-29 DIAGNOSIS — Z905 Acquired absence of kidney: Secondary | ICD-10-CM | POA: Insufficient documentation

## 2012-11-29 DIAGNOSIS — R1110 Vomiting, unspecified: Secondary | ICD-10-CM | POA: Insufficient documentation

## 2012-11-29 DIAGNOSIS — F329 Major depressive disorder, single episode, unspecified: Secondary | ICD-10-CM | POA: Insufficient documentation

## 2012-11-29 DIAGNOSIS — I10 Essential (primary) hypertension: Secondary | ICD-10-CM | POA: Insufficient documentation

## 2012-11-29 DIAGNOSIS — R197 Diarrhea, unspecified: Secondary | ICD-10-CM | POA: Insufficient documentation

## 2012-11-29 DIAGNOSIS — Z79899 Other long term (current) drug therapy: Secondary | ICD-10-CM | POA: Insufficient documentation

## 2012-11-29 DIAGNOSIS — Z91040 Latex allergy status: Secondary | ICD-10-CM | POA: Insufficient documentation

## 2012-11-29 DIAGNOSIS — E860 Dehydration: Secondary | ICD-10-CM | POA: Insufficient documentation

## 2012-11-29 DIAGNOSIS — Z8639 Personal history of other endocrine, nutritional and metabolic disease: Secondary | ICD-10-CM | POA: Insufficient documentation

## 2012-11-29 DIAGNOSIS — R634 Abnormal weight loss: Secondary | ICD-10-CM | POA: Insufficient documentation

## 2012-11-29 DIAGNOSIS — R55 Syncope and collapse: Secondary | ICD-10-CM | POA: Insufficient documentation

## 2012-11-29 DIAGNOSIS — Z9089 Acquired absence of other organs: Secondary | ICD-10-CM | POA: Insufficient documentation

## 2012-11-29 LAB — URINALYSIS, ROUTINE W REFLEX MICROSCOPIC
Bilirubin Urine: NEGATIVE
Glucose, UA: NEGATIVE mg/dL
Ketones, ur: NEGATIVE mg/dL
Leukocytes, UA: NEGATIVE
Nitrite: NEGATIVE
Protein, ur: NEGATIVE mg/dL
Specific Gravity, Urine: 1.028 (ref 1.005–1.030)
Urobilinogen, UA: 0.2 mg/dL (ref 0.0–1.0)
pH: 5 (ref 5.0–8.0)

## 2012-11-29 LAB — CBC WITH DIFFERENTIAL/PLATELET
Basophils Absolute: 0.1 10*3/uL (ref 0.0–0.1)
Basophils Relative: 1 % (ref 0–1)
HCT: 45.1 % (ref 39.0–52.0)
Hemoglobin: 16 g/dL (ref 13.0–17.0)
Lymphocytes Relative: 32 % (ref 12–46)
MCHC: 35.5 g/dL (ref 30.0–36.0)
Monocytes Absolute: 0.8 10*3/uL (ref 0.1–1.0)
Monocytes Relative: 10 % (ref 3–12)
Neutro Abs: 4 10*3/uL (ref 1.7–7.7)
Neutrophils Relative %: 51 % (ref 43–77)
RDW: 12.4 % (ref 11.5–15.5)
WBC: 7.8 10*3/uL (ref 4.0–10.5)

## 2012-11-29 LAB — COMPREHENSIVE METABOLIC PANEL
ALT: 36 U/L (ref 0–53)
AST: 24 U/L (ref 0–37)
Albumin: 4.2 g/dL (ref 3.5–5.2)
Alkaline Phosphatase: 60 U/L (ref 39–117)
CO2: 20 mEq/L (ref 19–32)
Chloride: 102 mEq/L (ref 96–112)
Creatinine, Ser: 1.06 mg/dL (ref 0.50–1.35)
GFR calc non Af Amer: 81 mL/min — ABNORMAL LOW (ref >90)
Potassium: 3.7 mEq/L (ref 3.5–5.1)
Total Bilirubin: 0.4 mg/dL (ref 0.3–1.2)

## 2012-11-29 LAB — POCT I-STAT TROPONIN I: Troponin i, poc: 0 ng/mL (ref 0.00–0.08)

## 2012-11-29 LAB — URINE MICROSCOPIC-ADD ON

## 2012-11-29 LAB — LIPASE, BLOOD: Lipase: 32 U/L (ref 11–59)

## 2012-11-29 MED ORDER — ONDANSETRON HCL 4 MG/2ML IJ SOLN
4.0000 mg | Freq: Once | INTRAMUSCULAR | Status: AC
  Administered 2012-11-29: 4 mg via INTRAVENOUS
  Filled 2012-11-29: qty 2

## 2012-11-29 MED ORDER — SODIUM CHLORIDE 0.9 % IV BOLUS (SEPSIS)
1000.0000 mL | Freq: Once | INTRAVENOUS | Status: AC
  Administered 2012-11-29: 1000 mL via INTRAVENOUS

## 2012-11-29 MED ORDER — ONDANSETRON HCL 4 MG PO TABS: 4.0000 mg | ORAL_TABLET | Freq: Four times a day (QID) | ORAL | Status: DC

## 2012-11-29 NOTE — Discharge Instructions (Signed)
Dehydration, Adult °Dehydration is when you lose more fluids from the body than you take in. Vital organs like the kidneys, brain, and heart cannot function without a proper amount of fluids and salt. Any loss of fluids from the body can cause dehydration.  °CAUSES  °· Vomiting. °· Diarrhea. °· Excessive sweating. °· Excessive urine output. °· Fever. °SYMPTOMS  °Mild dehydration °· Thirst. °· Dry lips. °· Slightly dry mouth. °Moderate dehydration °· Very dry mouth. °· Sunken eyes. °· Skin does not bounce back quickly when lightly pinched and released. °· Dark urine and decreased urine production. °· Decreased tear production. °· Headache. °Severe dehydration °· Very dry mouth. °· Extreme thirst. °· Rapid, weak pulse (more than 100 beats per minute at rest). °· Cold hands and feet. °· Not able to sweat in spite of heat and temperature. °· Rapid breathing. °· Blue lips. °· Confusion and lethargy. °· Difficulty being awakened. °· Minimal urine production. °· No tears. °DIAGNOSIS  °Your caregiver will diagnose dehydration based on your symptoms and your exam. Blood and urine tests will help confirm the diagnosis. The diagnostic evaluation should also identify the cause of dehydration. °TREATMENT  °Treatment of mild or moderate dehydration can often be done at home by increasing the amount of fluids that you drink. It is best to drink small amounts of fluid more often. Drinking too much at one time can make vomiting worse. Refer to the home care instructions below. °Severe dehydration needs to be treated at the hospital where you will probably be given intravenous (IV) fluids that contain water and electrolytes. °HOME CARE INSTRUCTIONS  °· Ask your caregiver about specific rehydration instructions. °· Drink enough fluids to keep your urine clear or pale yellow. °· Drink small amounts frequently if you have nausea and vomiting. °· Eat as you normally do. °· Avoid: °· Foods or drinks high in sugar. °· Carbonated  drinks. °· Juice. °· Extremely hot or cold fluids. °· Drinks with caffeine. °· Fatty, greasy foods. °· Alcohol. °· Tobacco. °· Overeating. °· Gelatin desserts. °· Wash your hands well to avoid spreading bacteria and viruses. °· Only take over-the-counter or prescription medicines for pain, discomfort, or fever as directed by your caregiver. °· Ask your caregiver if you should continue all prescribed and over-the-counter medicines. °· Keep all follow-up appointments with your caregiver. °SEEK MEDICAL CARE IF: °· You have abdominal pain and it increases or stays in one area (localizes). °· You have a rash, stiff neck, or severe headache. °· You are irritable, sleepy, or difficult to awaken. °· You are weak, dizzy, or extremely thirsty. °SEEK IMMEDIATE MEDICAL CARE IF:  °· You are unable to keep fluids down or you get worse despite treatment. °· You have frequent episodes of vomiting or diarrhea. °· You have blood or green matter (bile) in your vomit. °· You have blood in your stool or your stool looks black and tarry. °· You have not urinated in 6 to 8 hours, or you have only urinated a small amount of very dark urine. °· You have a fever. °· You faint. °MAKE SURE YOU:  °· Understand these instructions. °· Will watch your condition. °· Will get help right away if you are not doing well or get worse. °Document Released: 01/25/2005 Document Revised: 04/19/2011 Document Reviewed: 09/14/2010 °ExitCare® Patient Information ©2014 [employer]. ° °Diarrhea °Diarrhea is frequent loose and watery bowel movements. It can cause you to feel weak and dehydrated. Dehydration can cause you to become tired and thirsty,   have a dry mouth, and have decreased urination that often is dark yellow. Diarrhea is a sign of another problem, most often an infection that will not last long. In most cases, diarrhea typically lasts 2 3 days. However, it can last longer if it is a sign of something more serious. It is important to treat your  diarrhea as directed by your caregive to lessen or prevent future episodes of diarrhea. CAUSES  Some common causes include:  Gastrointestinal infections caused by viruses, bacteria, or parasites.  Food poisoning or food allergies.  Certain medicines, such as antibiotics, chemotherapy, and laxatives.  Artificial sweeteners and fructose.  Digestive disorders. HOME CARE INSTRUCTIONS  Ensure adequate fluid intake (hydration): have 1 cup (8 oz) of fluid for each diarrhea episode. Avoid fluids that contain simple sugars or sports drinks, fruit juices, whole milk products, and sodas. Your urine should be clear or pale yellow if you are drinking enough fluids. Hydrate with an oral rehydration solution that you can purchase at pharmacies, retail stores, and online. You can prepare an oral rehydration solution at home by mixing the following ingredients together:    tsp table salt.   tsp baking soda.   tsp salt substitute containing potassium chloride.  1  tablespoons sugar.  1 L (34 oz) of water.  Certain foods and beverages may increase the speed at which food moves through the gastrointestinal (GI) tract. These foods and beverages should be avoided and include:  Caffeinated and alcoholic beverages.  High-fiber foods, such as raw fruits and vegetables, nuts, seeds, and whole grain breads and cereals.  Foods and beverages sweetened with sugar alcohols, such as xylitol, sorbitol, and mannitol.  Some foods may be well tolerated and may help thicken stool including:  Starchy foods, such as rice, toast, pasta, low-sugar cereal, oatmeal, grits, baked potatoes, crackers, and bagels.  Bananas.  Applesauce.  Add probiotic-rich foods to help increase healthy bacteria in the GI tract, such as yogurt and fermented milk products.  Wash your hands well after each diarrhea episode.  Only take over-the-counter or prescription medicines as directed by your caregiver.  Take a warm bath to  relieve any burning or pain from frequent diarrhea episodes. SEEK IMMEDIATE MEDICAL CARE IF:   You are unable to keep fluids down.  You have persistent vomiting.  You have blood in your stool, or your stools are black and tarry.  You do not urinate in 6 8 hours, or there is only a small amount of very dark urine.  You have abdominal pain that increases or localizes.  You have weakness, dizziness, confusion, or lightheadedness.  You have a severe headache.  Your diarrhea gets worse or does not get better.  You have a fever or persistent symptoms for more than 2 3 days.  You have a fever and your symptoms suddenly get worse. MAKE SURE YOU:   Understand these instructions.  Will watch your condition.  Will get help right away if you are not doing well or get worse. Document Released: 01/15/2002 Document Revised: 01/12/2012 Document Reviewed: 10/03/2011 [facility] Patient Information 2014 [place], [place].  Nausea and Vomiting Nausea is a sick feeling that often comes before throwing up (vomiting). Vomiting is a reflex where stomach contents come out of your mouth. Vomiting can cause severe loss of body fluids (dehydration). Children and elderly adults can become dehydrated quickly, especially if they also have diarrhea. Nausea and vomiting are symptoms of a condition or disease. It is important to find the cause of  your symptoms. CAUSES   Direct irritation of the stomach lining. This irritation can result from increased acid production (gastroesophageal reflux disease), infection, food poisoning, taking certain medicines (such as nonsteroidal anti-inflammatory drugs), alcohol use, or tobacco use.  Signals from the brain.These signals could be caused by a headache, heat exposure, an inner ear disturbance, increased pressure in the brain from injury, infection, a tumor, or a concussion, pain, emotional stimulus, or metabolic problems.  An obstruction in the gastrointestinal tract  (bowel obstruction).  Illnesses such as diabetes, hepatitis, gallbladder problems, appendicitis, kidney problems, cancer, sepsis, atypical symptoms of a heart attack, or eating disorders.  Medical treatments such as chemotherapy and radiation.  Receiving medicine that makes you sleep (general anesthetic) during surgery. DIAGNOSIS Your caregiver may ask for tests to be done if the problems do not improve after a few days. Tests may also be done if symptoms are severe or if the reason for the nausea and vomiting is not clear. Tests may include:  Urine tests.  Blood tests.  Stool tests.  Cultures (to look for evidence of infection).  X-rays or other imaging studies. Test results can help your caregiver make decisions about treatment or the need for additional tests. TREATMENT You need to stay well hydrated. Drink frequently but in small amounts.You may wish to drink water, sports drinks, clear broth, or eat frozen ice pops or gelatin dessert to help stay hydrated.When you eat, eating slowly may help prevent nausea.There are also some antinausea medicines that may help prevent nausea. HOME CARE INSTRUCTIONS   Take all medicine as directed by your caregiver.  If you do not have an appetite, do not force yourself to eat. However, you must continue to drink fluids.  If you have an appetite, eat a normal diet unless your caregiver tells you differently.  Eat a variety of complex carbohydrates (rice, wheat, potatoes, bread), lean meats, yogurt, fruits, and vegetables.  Avoid high-fat foods because they are more difficult to digest.  Drink enough water and fluids to keep your urine clear or pale yellow.  If you are dehydrated, ask your caregiver for specific rehydration instructions. Signs of dehydration may include:  Severe thirst.  Dry lips and mouth.  Dizziness.  Dark urine.  Decreasing urine frequency and amount.  Confusion.  Rapid breathing or pulse. SEEK IMMEDIATE  MEDICAL CARE IF:   You have blood or brown flecks (like coffee grounds) in your vomit.  You have black or bloody stools.  You have a severe headache or stiff neck.  You are confused.  You have severe abdominal pain.  You have chest pain or trouble breathing.  You do not urinate at least once every 8 hours.  You develop cold or clammy skin.  You continue to vomit for longer than 24 to 48 hours.  You have a fever. MAKE SURE YOU:   Understand these instructions.  Will watch your condition.  Will get help right away if you are not doing well or get worse. Document Released: 01/25/2005 Document Revised: 04/19/2011 Document Reviewed: 06/24/2010 [facility] Patient Information 2014 [place], [place].  Syncope Syncope is a fainting spell. This means the person loses consciousness and drops to the ground. The person is generally unconscious for less than 5 minutes. The person may have some muscle twitches for up to 15 seconds before waking up and returning to normal. Syncope occurs more often in elderly people, but it can happen to anyone. While most causes of syncope are not dangerous, syncope can be a  sign of a serious medical problem. It is important to seek medical care.  CAUSES  Syncope is caused by a sudden decrease in blood flow to the brain. The specific cause is often not determined. Factors that can trigger syncope include:  Taking medicines that lower blood pressure.  Sudden changes in posture, such as standing up suddenly.  Taking more medicine than prescribed.  Standing in one place for too long.  Seizure disorders.  Dehydration and excessive exposure to heat.  Low blood sugar (hypoglycemia).  Straining to have a bowel movement.  Heart disease, irregular heartbeat, or other circulatory problems.  Fear, emotional distress, seeing blood, or severe pain. SYMPTOMS  Right before fainting, you may:  Feel dizzy or lightheaded.  Feel nauseous.  See all white or  all black in your field of vision.  Have cold, clammy skin. DIAGNOSIS  Your caregiver will ask about your symptoms, perform a physical exam, and perform electrocardiography (ECG) to record the electrical activity of your heart. Your caregiver may also perform other heart or blood tests to determine the cause of your syncope. TREATMENT  In most cases, no treatment is needed. Depending on the cause of your syncope, your caregiver may recommend changing or stopping some of your medicines. HOME CARE INSTRUCTIONS  Have someone stay with you until you feel stable.  Do not drive, operate machinery, or play sports until your caregiver says it is okay.  Keep all follow-up appointments as directed by your caregiver.  Lie down right away if you start feeling like you might faint. Breathe deeply and steadily. Wait until all the symptoms have passed.  Drink enough fluids to keep your urine clear or pale yellow.  If you are taking blood pressure or heart medicine, get up slowly, taking several minutes to sit and then stand. This can reduce dizziness. SEEK IMMEDIATE MEDICAL CARE IF:   You have a severe headache.  You have unusual pain in the chest, abdomen, or back.  You are bleeding from the mouth or rectum, or you have black or tarry stool.  You have an irregular or very fast heartbeat.  You have pain with breathing.  You have repeated fainting or seizure-like jerking during an episode.  You faint when sitting or lying down.  You have confusion.  You have difficulty walking.  You have severe weakness.  You have vision problems. If you fainted, call your local emergency services (911 in [place] Do not drive yourself to the hospital.  MAKE SURE YOU:  Understand these instructions.  Will watch your condition.  Will get help right away if you are not doing well or get worse. Document Released: 01/25/2005 Document Revised: 07/27/2011 Document Reviewed: 03/26/2011 [facility] Patient  Information 2014 [place], [place].   RESOURCE GUIDE  Chronic Pain Problems: Contact [facility]  [phone] Patients need to be referred by their primary care doctor.  Insufficient Money for Medicine: Contact United Way:  call [phone]  No Primary Care Doctor: - Call Health Connect  [phone] - can help you locate a primary care doctor that  accepts your insurance, provides certain services, etc. - Physician Referral [phone]  Agencies that provide inexpensive medical care: - [facility]  [phone] - [facility]  [phone] - [facility]  [phone] - Women's Clinic  [phone] - Planned Parenthood  [phone] - [facility]  [phone]  Medicaid-accepting [facility] Providers: - [facility]- [address]  [phone], Mon-Fri 9am-7pm, Sat 9am-1pm - [facility]- [address] [place], [place]  [phone] - [facility]- [address] [place]  [phone] [facility]- [address]  [phone] - Renaye Rakers- [address] [place], [address], [phone]  Only accepts [place] patients after they have their name  applied to their card  Self Pay (no insurance) in [facility]: - Sickle Cell Patients - [facility] Internal Medicine  [address] [place], [phone] - [facility]- [address] [place]  [phone]       [facility] [place]- [address]       -     [facility]- see information above (Speak to [employer] if you do not have insurance)       -  [facility]- [address],  [phone]       -  [facility]- [address] [phone]       -  Dr Julio Sicks-  [address], [place], [phone]       -  [facility] - [address] [phone]       -  [facility]-  [address], [phone], also [address] [phone]       -     [facility]- [address] [place], [phone], 1st & 3rd Saturday         every month, 10am-1pm  -     [facility]   [address] [place], [place].   Phone:  [phone], Fax:  [phone]. Hours of Operation:  9 am - 6 pm, M-F.  -     [facility]   [address], [place]   Phone: [phone], Fax: [phone]. Hours of Operation:  8:30 am - 5:30 pm, M-F.  [facility] [address] [place], [place] [address] [phone]  [facility] [address] [place], [place] [address] [phone]  1) Find a Doctor and Pay Out of Pocket Although you won't have to find out who is covered by your insurance plan, it is a good idea to ask around and get recommendations. You will then need to call the office and see if the doctor you have chosen will accept you as a new patient and what types of options they offer for patients who are self-pay. Some doctors offer discounts or will set up payment plans for their patients who do not have insurance, but you will need to ask so you aren't surprised when you get to your appointment.  2) Contact Your Local Health Department Not all health departments have doctors that can see patients for sick visits, but many do, so it is worth a call to see if yours does. If you don't know where your local health department is, you can check in your phone book. The CDC also has a tool to help you locate your state's health department, and many state websites also have listings of all of their local health departments.  3) Find a Walk-in Clinic If your illness is not likely to be very severe or complicated, you may want to try a walk in clinic. These are popping up all over the country  in pharmacies, drugstores, and shopping centers. They're usually staffed by nurse practitioners or  physician assistants that have been trained to treat common illnesses and complaints. They're usually fairly quick and inexpensive. However, if you have serious medical issues or chronic medical problems, these are probably not your best option  STD Testing - [facility] Department of [facility] [place], STD Clinic, [address], [place], phone [phone] or [phone].  Monday - Friday, call for an appointment. [facility] of [employer], STD Clinic, [address], [place], phone [phone] or [phone].  Monday - Friday, call for an appointment.  Abuse/Neglect: [facility] Child Abuse Hotline 831-338-1512 [facility] Child Abuse Hotline 939-775-2057 (After Hours)  Emergency Shelter:  [facility] [phone]  Maternity Homes: - Room at the [place] [facility] [phone] - [facility] [phone]  MRSA Hotline #:   406-710-1295  Dental Assistance If unable to pay or uninsured, contact:  [facility]. to become qualified for the adult dental clinic.  Patients with Medicaid: [facility] [phone] [address], [phone] [address], [phone]  If unable to pay, or uninsured, contact [facility] [phone] in [place], [phone] in [facility]) to become qualified for the adult dental clinic  [facility] [address] [place], [place] [address] [phone] [email or web address]  Other Proofreader Services: - [facility]- [address] [place], [place], [place], [address], [phone], Ext. 123, 2nd and 4th Thursday of the month at 6:30am.  10 clients each day by appointment, can sometimes see walk-in patients if someone does not show for an appointment. [facility]- [address] [place], [place], [address], [phone] - [facility] [address],  [place], [place], [address], [phone] - [facility]- [phone] [facility] Health Department- [phone] [facility] Health [phone]       Behavioral Health Resources in the [facility]  Intensive Outpatient Programs: [facility]      [address] [place], [place] [phone] Both a day and evening program       [facility] Outpatient     [address]        [place], [place] [address] [phone]         ADS: [facility] [address] [place] [phone]  [facility] Mental Health ACCESS LINE: [phone] or [phone] [address] [place], [place] [address] [email or web address]   Substance Abuse Resources: - [facility] Drug [facility]  [phone] - [facility] [phone] - [facility] [place] [phone] Floydene Flock [phone] - Residential & Outpatient Substance Abuse Program  [phone]  Psychological Services: [facility]  [phone] [facility] Services  [phone] - [facility], [phone] [place]. [address], [place], ACCESS LINE: [phone] or [phone], [email or web address]  Mobile Crisis Teams:                                        Therapeutic Alternatives         Mobile Crisis Care Unit [phone]             [facility] [address] Ginette Otto [phone]  [facility] [address] [place] [phone]  Self-Help/Support Groups: Mental Health Assoc. of [employer] of support groups [phone] (call for more info)  Narcotics Anonymous (NA) Caring Services [address] [place] - 2 meetings at this location  Residential Treatment Programs:  [facility]      [address]        [employer] [place]       [phone]         [facility] [address] [place] [address]36 [place], [place]  [address] [phone]  [facility]  [address] [place] [address] [place], [place] [address] [phone] Admissions: 8am-3pm M-F  [facility]     [address]        [place], [place] [address]       [phone]         [facility] [address] [place]ling Manns [place], [place] [phone]  [facility] [address] [place], [place] [phone]  [facility] - Intensive Outpatient      [address]     [place], [place]       [phone]         [facility] ([facility]     [address] [place], [place] [phone] or [phone]  Residential Treatment Services (RTS), Medicaid [address] [place], [place] [phone]  [facility] [address]                                               [address] [place] [phone]  [facility] Resources: [phone]               General Therapy                                                Angie Fava, PhD        [address] [place], [place] [address]         [phone]   Insurance  [facility] Behavioral   [address] [place], [place] [address] [phone]  [facility] [address] [place], [place] [address] [phone] Insurance/Medicaid/sponsorship through [facility] and Families                                              [address]                                        [place], [place] [address]    Therapy/tele-psych/case         [phone]          [facility] [address], [place]  [address]  Adolescent/group home/case  management [phone]                                           Creola Corn PhD       General therapy       Insurance   [phone]         Dr.  Lolly Mustache, [place], M-F [phone]  [facility]  United [employer] [facility] Dept. [address] [address]                 [address]         [address]  [place] Phone:  [phone]                                  Phone:  [phone]                   Phone:  [phone]  [facility], [phone] - [facility] - [facility]- [phone]       -     [facility] in [place], [address],             [phone], Insurance  [place] Child Abuse Hotline [phone] or [phone] (After Hours)

## 2012-11-29 NOTE — ED Provider Notes (Signed)
TIME SEEN: 7:49 AM  CHIEF COMPLAINT: vomiting, diarrhea, lightheadedness, syncope  HPI: Pt is a 47 y.o. 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 He reports he has also dropped one pant size and feels lightheaded regularly, worse with standing.  Today when getting up from the couch to use the bathroom, he had a syncopal event.  No seizure activity, tongue biting, incontinence.  No chest pain, SOB, palpitations prior to the event or in the last month.  Pt denies fever, chills, night sweats, abdominal pain.  States he has had intermittent wheezing over the past month.  No blood stools or melena.  No dysuria or hematuria.  No h/o cancer.  ROS: See HPI Constitutional: no fever  Eyes: no drainage  ENT: no runny nose   Cardiovascular:  no chest pain  Resp: no SOB  GI:  vomiting GU: no dysuria Integumentary: no rash  Allergy: no hives  Musculoskeletal: no leg swelling  Neurological: no slurred speech ROS otherwise negative  PAST MEDICAL HISTORY/PAST SURGICAL HISTORY:  Past Medical History  Diagnosis Date  . Hypertension   . Depression     MEDICATIONS:  Prior to Admission medications   Medication Sig Start Date End Date Taking? Authorizing Provider  lurasidone (LATUDA) 40 MG TABS Take 40 mg by mouth daily with breakfast.    Historical Provider, MD  ondansetron (ZOFRAN) 8 MG tablet Take 1 tablet (8 mg total) by mouth every 8 (eight) hours as needed for nausea. 06/08/12   Suzi Roots, MD  oxyCODONE-acetaminophen (PERCOCET/ROXICET) 5-325 MG per tablet Take 1-2 tablets by mouth every 6 (six) hours as needed for pain. 06/08/12   Suzi Roots, MD    ALLERGIES:  Allergies  Allergen Reactions  . Latex Shortness Of Breath and Swelling  . Compazine Other (See Comments)    seziures   . Promethazine Hcl Other (See Comments)    seziures     SOCIAL HISTORY:  History  Substance Use Topics  . Smoking status: Never Smoker   . Smokeless tobacco: Current User    Types: Snuff  . Alcohol Use: No    FAMILY HISTORY: History reviewed. No pertinent family history.  EXAM: BP 127/83  Pulse 64  Temp(Src) 98.9 F (37.2 C) (Oral)  Resp 11  SpO2 98% CONSTITUTIONAL: Alert and oriented and responds appropriately to questions. Well-appearing; well-nourished HEAD: Normocephalic; atraumatic EYES: Conjunctivae clear, PERRL ENT: normal nose; no rhinorrhea; dry mucous membranes; pharynx without lesions noted; no dental injury; no septal hematoma NECK: Supple, no meningismus, no LAD  CARD: RRR; S1 and S2 appreciated; no murmurs, no clicks, no rubs, no gallops RESP: Normal chest excursion without splinting or tachypnea; breath sounds clear and equal bilaterally; no wheezes, no rhonchi, no rales,  ABD/GI: Normal bowel sounds; non-distended; soft, non-tender, no rebound, no guarding BACK:  The back appears normal and is non-tender to palpation, there is no CVA tenderness; no midline spinal tenderness, step off or deformity EXT: Normal ROM in all joints; non-tender to palpation; no edema; normal capillary refill; no cyanosis    SKIN: Normal color for age and race; warm NEURO: Moves all extremities equally; strength 5/5 in all 4 extremities; CN 2-12 intact; no dysmetria to finger to nose testing PSYCH: The patient's mood and manner are appropriate. Grooming and personal hygiene are appropriate.  MEDICAL DECISION MAKING: Pt with V/D x 1 month  with weight loss and lightheadedness and syncope today.  Abdominal exam is benign.  Vitals WNL.  Will check orthostats.  Will check labs, urine, give IVF and antiemetics.  Will also obtain EKG although suspicion for ACS, arrhythmia is low.  If work up negative and patient can tolerate po, will dc home with outpatient follow up.  ED PROGRESS: Labs are reassuring. Chest x-ray clear. Patient does have a  moderate amount of hemoglobin in his urine but only 3-6 RBCs. No flank pain or other symptoms to suggest kidney stone. Will have him followup with his primary care physician for this. Patient still feels nauseous and at this point is unable to tolerate by mouth challenge. Will give a second dose of Zofran.  10:35 AM  Pt reports feeling much better. I feel his syncopal episode today with secondary to dehydration. He did have positive orthostatic vital signs. He's been able to tolerate by mouth without difficulty. We'll discharge home with return precautions, prescription for Zofran, PCP followup information. Patient verbalizes understanding and is comfortable plan.   Results for orders placed during the hospital encounter of 11/29/12  CBC WITH DIFFERENTIAL      Result Value Range   WBC 7.8  4.0 - 10.5 K/uL   RBC 5.19  4.22 - 5.81 MIL/uL   Hemoglobin 16.0  13.0 - 17.0 g/dL   HCT 16.1  09.6 - 04.5 %   MCV 86.9  78.0 - 100.0 fL   MCH 30.8  26.0 - 34.0 pg   MCHC 35.5  30.0 - 36.0 g/dL   RDW 40.9  81.1 - 91.4 %   Platelets 265  150 - 400 K/uL   Neutrophils Relative % 51  43 - 77 %   Neutro Abs 4.0  1.7 - 7.7 K/uL   Lymphocytes Relative 32  12 - 46 %   Lymphs Abs 2.5  0.7 - 4.0 K/uL   Monocytes Relative 10  3 - 12 %   Monocytes Absolute 0.8  0.1 - 1.0 K/uL   Eosinophils Relative 6 (*) 0 - 5 %   Eosinophils Absolute 0.5  0.0 - 0.7 K/uL   Basophils Relative 1  0 - 1 %   Basophils Absolute 0.1  0.0 - 0.1 K/uL  COMPREHENSIVE METABOLIC PANEL      Result Value Range   Sodium 136  135 - 145 mEq/L   Potassium 3.7  3.5 - 5.1 mEq/L   Chloride 102  96 - 112 mEq/L   CO2 20  19 - 32 mEq/L   Glucose, Bld 112 (*) 70 - 99 mg/dL   BUN 11  6 - 23 mg/dL   Creatinine, Ser 7.82  0.50 - 1.35 mg/dL   Calcium 9.7  8.4 - 95.6 mg/dL   Total Protein 7.3  6.0 - 8.3 g/dL   Albumin 4.2  3.5 - 5.2 g/dL   AST 24  0 - 37 U/L   ALT 36  0 - 53 U/L   Alkaline Phosphatase 60  39 - 117 U/L   Total Bilirubin 0.4  0.3 -  1.2 mg/dL   GFR calc non Af Amer 81 (*) >90 mL/min   GFR calc Af Amer >90  >90 mL/min  LIPASE, BLOOD      Result Value Range   Lipase 32  11 - 59 U/L  URINALYSIS, ROUTINE W REFLEX MICROSCOPIC      Result Value Range   Color, Urine YELLOW  YELLOW   APPearance CLEAR  CLEAR  Specific Gravity, Urine 1.028  1.005 - 1.030   pH 5.0  5.0 - 8.0   Glucose, UA NEGATIVE  NEGATIVE mg/dL   Hgb urine dipstick MODERATE (*) NEGATIVE   Bilirubin Urine NEGATIVE  NEGATIVE   Ketones, ur NEGATIVE  NEGATIVE mg/dL   Protein, ur NEGATIVE  NEGATIVE mg/dL   Urobilinogen, UA 0.2  0.0 - 1.0 mg/dL   Nitrite NEGATIVE  NEGATIVE   Leukocytes, UA NEGATIVE  NEGATIVE  URINE MICROSCOPIC-ADD ON      Result Value Range   Squamous Epithelial / LPF RARE  RARE   WBC, UA 0-2  <3 WBC/hpf   RBC / HPF 3-6  <3 RBC/hpf   Bacteria, UA RARE  RARE  POCT I-STAT TROPONIN I      Result Value Range   Troponin i, poc 0.00  0.00 - 0.08 ng/mL   Comment 3            Dg Chest 2 View  11/29/2012   CLINICAL DATA:  No active infiltrate or effusion is seen. Mediastinal contours appear normal. The heart is within normal limits in size. No bony abnormality is seen.  EXAM: CHEST  2 VIEW  COMPARISON:  No active lung disease.  FINDINGS: The heart size and mediastinal contours are within normal limits. Both lungs are clear. The visualized skeletal structures are unremarkable.  IMPRESSION: No active cardiopulmonary disease.   Electronically Signed   By: Dwyane Dee M.D.   On: 11/29/2012 08:12    EKG Interpretation     Ventricular Rate:  67 PR Interval:  167 QRS Duration: 84 QT Interval:  393 QTC Calculation: 415 R Axis:   68 Text Interpretation:  Sinus rhythm ST elev, probable normal early repol pattern             Teal Raben N Shiva Karis, DO 11/29/12 1036

## 2012-11-29 NOTE — ED Notes (Signed)
Pt c/o of lightheadedness, dizziness, nausea vomiting x1 month. States that he passed out this morning when going to the bathroom. Denies fever abd pain.

## 2012-11-29 NOTE — Progress Notes (Signed)
[facility] provided pt with a list of primary care resources and a [facility] [employer] application.  °

## 2013-06-18 IMAGING — CT CT ABD-PELV W/O CM
2 of 4 series · 17 of 46 positions shown, 19 images · non-contrast
Comparison: CT urogram 03/17/2004.  Scrotal ultrasound today.

CLINICAL DATA: Progressive scrotal and left flank pain for 3 weeks.
History of kidney stones.

CT ABDOMEN AND PELVIS WITHOUT CONTRAST
TECHNIQUE: Multidetector CT imaging of the abdomen and pelvis was
performed following the standard protocol without intravenous
contrast.

[Series 3: renal stone · axial · 0.98mm/px · z∈[-597,-102]mm · 14 of 108 slices shown, 16 images]
[im 5/108  soft-tissue]
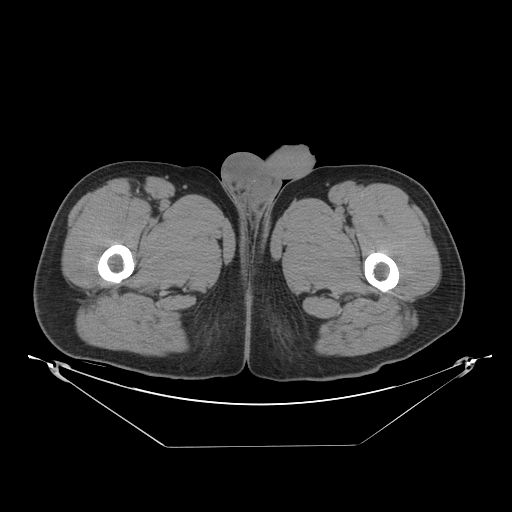
[im 5/108  bone]
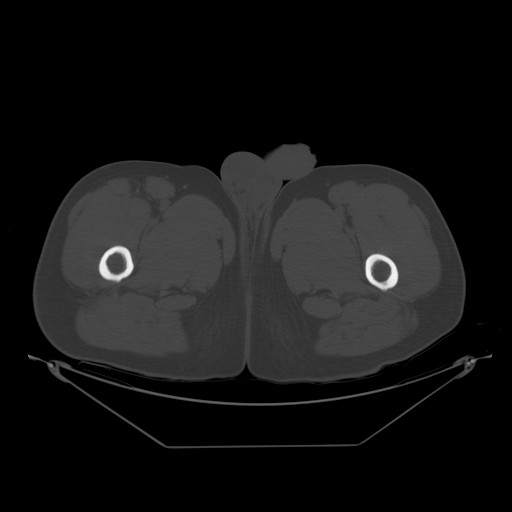
[im 14/108  soft-tissue]
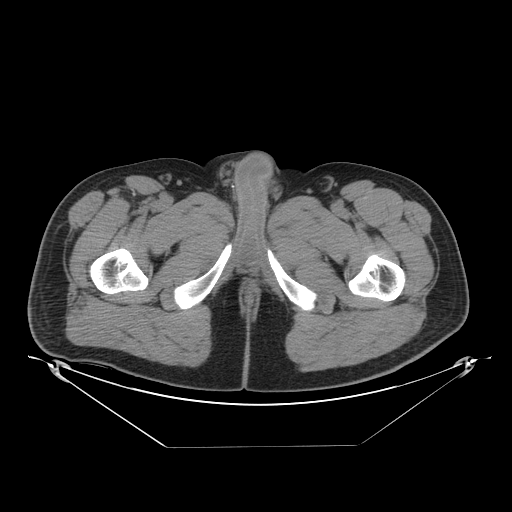
[im 19/108  soft-tissue]
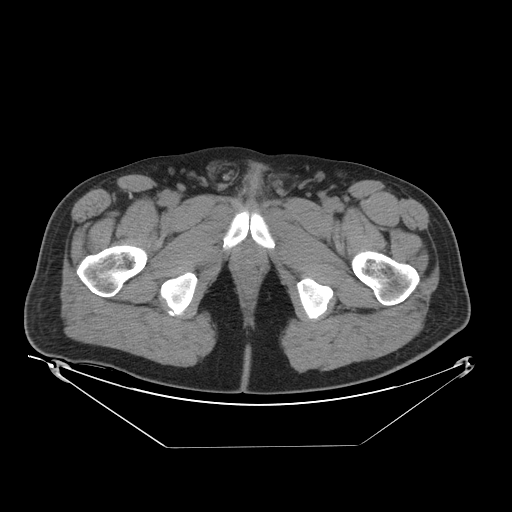
[im 28/108  soft-tissue]
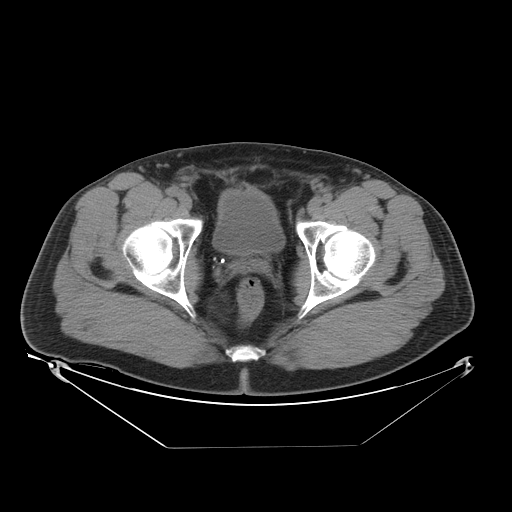
[im 38/108  soft-tissue]
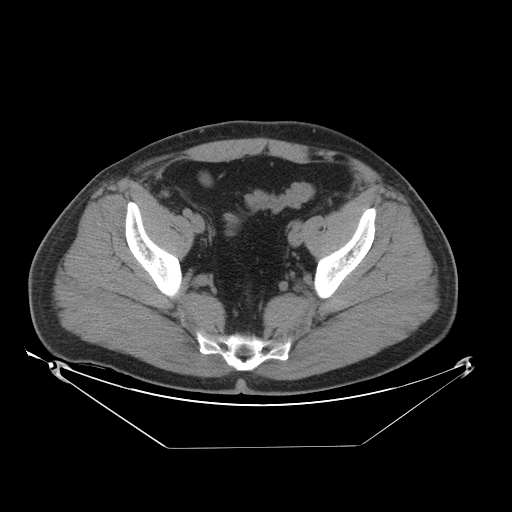
[im 42/108  soft-tissue]
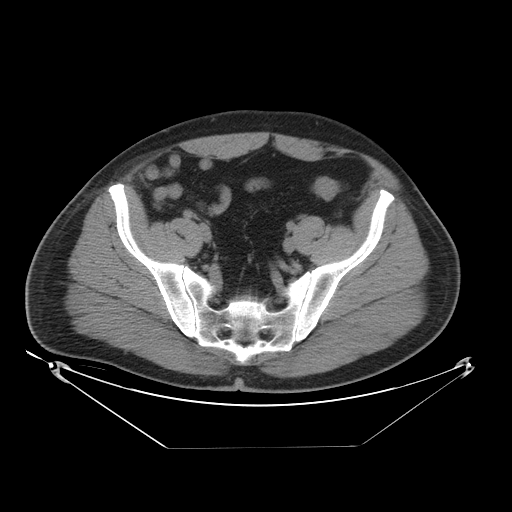
[im 52/108  soft-tissue]
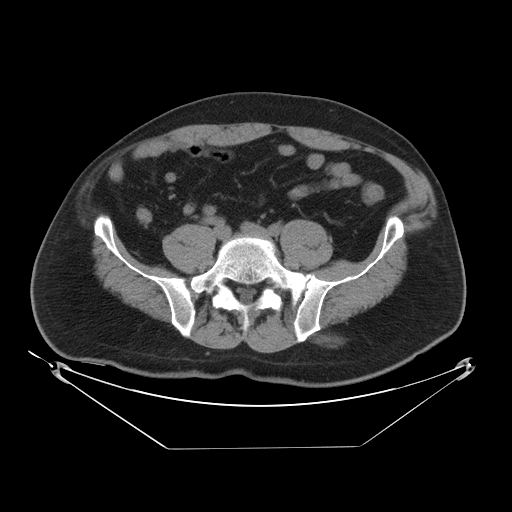
[im 56/108  soft-tissue]
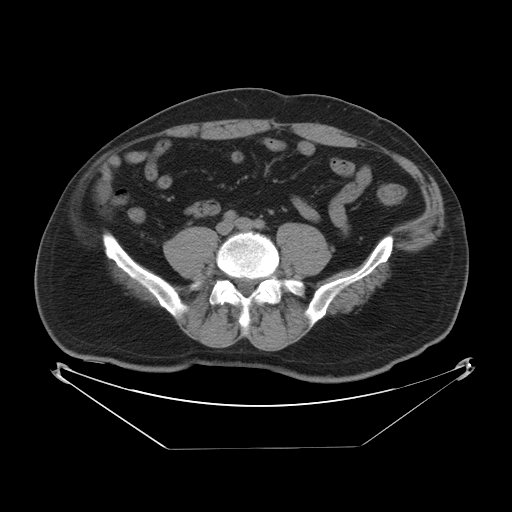
[im 66/108  soft-tissue]
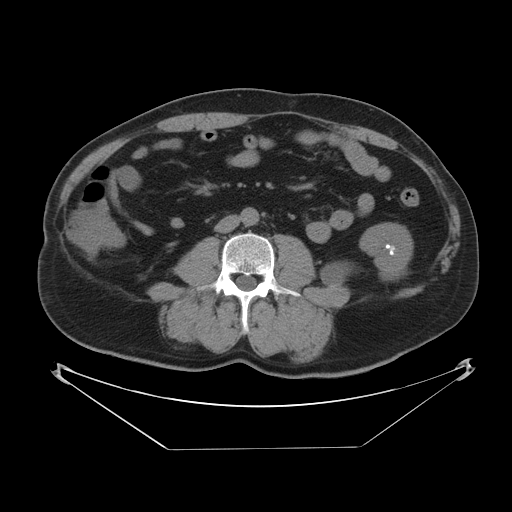
[im 66/108  bone]
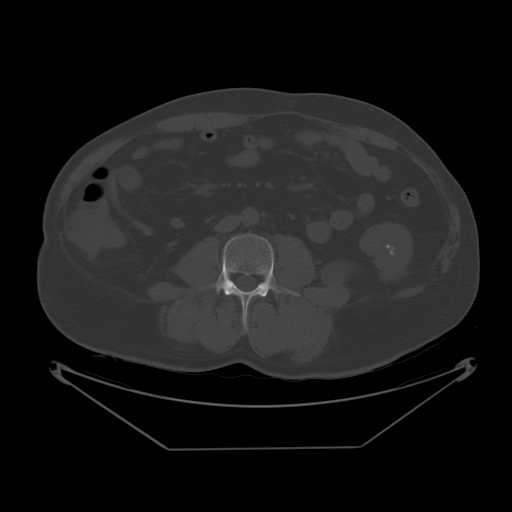
[im 70/108  soft-tissue]
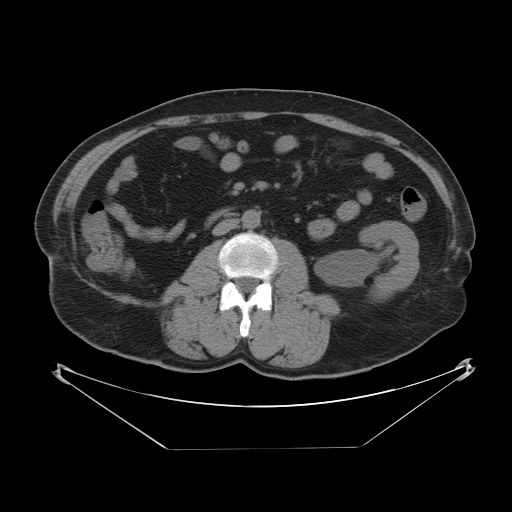
[im 80/108  soft-tissue]
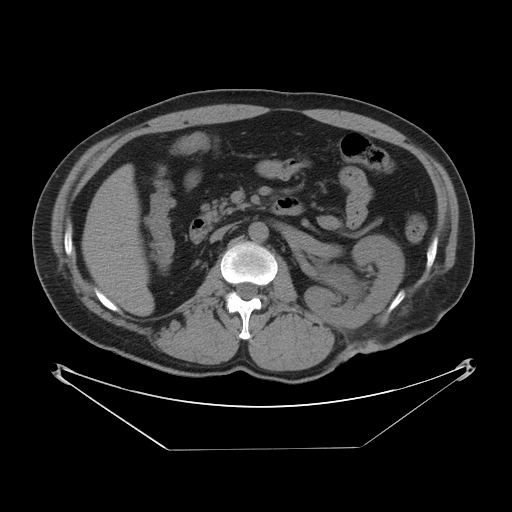
[im 89/108  soft-tissue]
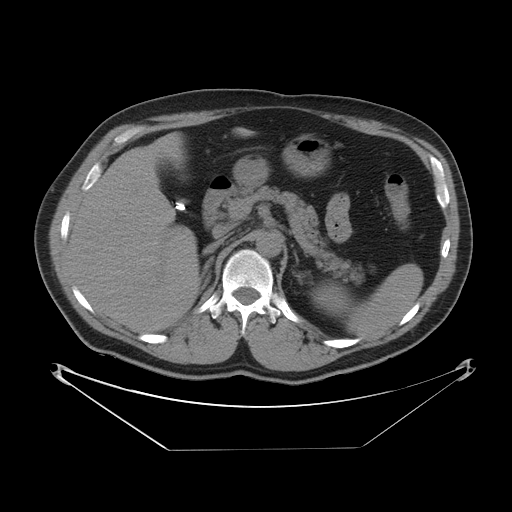
[im 94/108  soft-tissue]
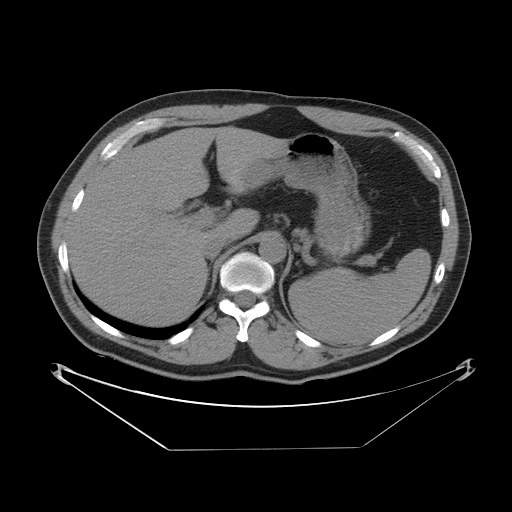
[im 103/108  soft-tissue]
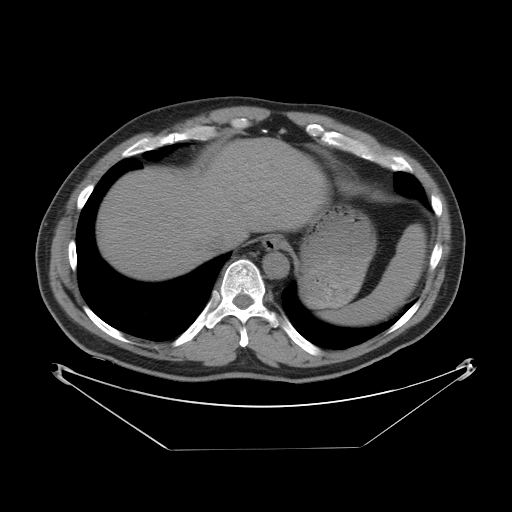

[Series 501: coronal · coronal · 0.98mm/px · 3 of 104 slices shown]
[im 35/104  soft-tissue]
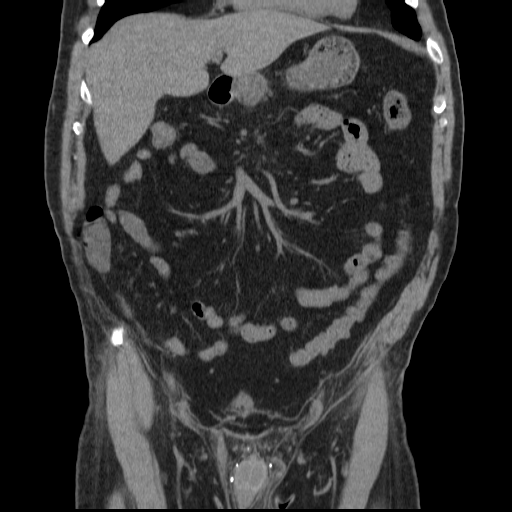
[im 46/104  soft-tissue]
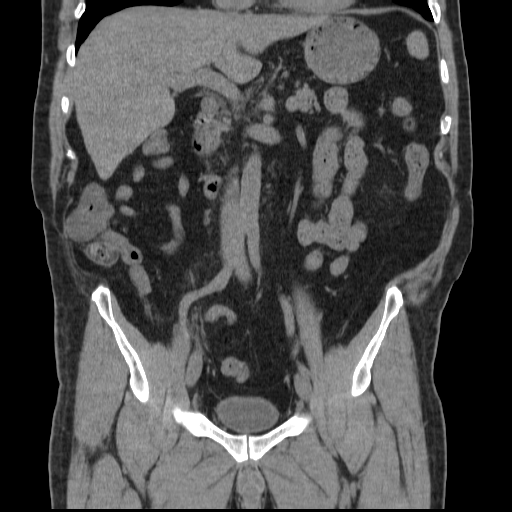
[im 58/104  soft-tissue]
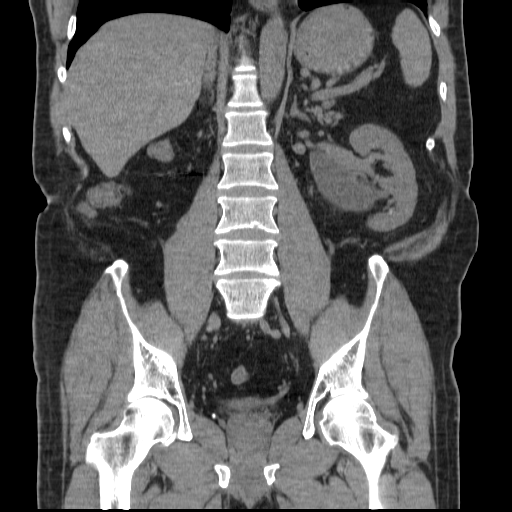

[17 of 46 positions shown; findings below may reference images not displayed]

FINDINGS: The lung bases are clear.  The patient is status post
right nephrectomy.  There is no mass or inflammatory process within
the nephrectomy bed.  There is no hernia.  The left kidney
demonstrates a chronic extrarenal pelvis.  The ureter is not
dilated.  Calyceal calculi in the lower pole of the left kidney
have decreased in size compared with the prior examination.  There
is now measuring 9 mm in total.  There is a 2 mm calculus in the
interpolar region.  There is no evidence of ureteral or bladder
calculus.

The liver, spleen, pancreas and adrenal glands appear normal.  The
gallbladder is surgically absent.  There is no significant biliary
dilatation.

The bowel gas pattern is normal.  No inflammatory changes are seen.
Prostate gland and seminal vesicles appear normal.  There are
stable bilateral pelvic phleboliths.

No acute or significant osseous findings are seen.
IMPRESSION: 1.  Decreased size of calculi in the lower pole calyces of the left
kidney.
2.  Stable extrarenal left renal pelvis.  No evidence of ureteral
dilatation or ureteral calculus.
3.  Stable appearance of the right nephrectomy bed.

## 2019-04-09 ENCOUNTER — Encounter (HOSPITAL_BASED_OUTPATIENT_CLINIC_OR_DEPARTMENT_OTHER): Payer: Self-pay | Admitting: *Deleted

## 2019-04-09 ENCOUNTER — Other Ambulatory Visit: Payer: Self-pay

## 2019-04-09 ENCOUNTER — Emergency Department (HOSPITAL_BASED_OUTPATIENT_CLINIC_OR_DEPARTMENT_OTHER)
Admission: EM | Admit: 2019-04-09 | Discharge: 2019-04-09 | Disposition: A | Discharge status: Home / Self Care | Payer: Self-pay | Attending: Emergency Medicine | Admitting: Emergency Medicine

## 2019-04-09 ENCOUNTER — Emergency Department (HOSPITAL_BASED_OUTPATIENT_CLINIC_OR_DEPARTMENT_OTHER): Payer: Self-pay

## 2019-04-09 DIAGNOSIS — W268XXA Contact with other sharp object(s), not elsewhere classified, initial encounter: Secondary | ICD-10-CM | POA: Insufficient documentation

## 2019-04-09 DIAGNOSIS — Y9289 Other specified places as the place of occurrence of the external cause: Secondary | ICD-10-CM | POA: Insufficient documentation

## 2019-04-09 DIAGNOSIS — S61412A Laceration without foreign body of left hand, initial encounter: Secondary | ICD-10-CM | POA: Insufficient documentation

## 2019-04-09 DIAGNOSIS — Y9389 Activity, other specified: Secondary | ICD-10-CM | POA: Insufficient documentation

## 2019-04-09 DIAGNOSIS — Y999 Unspecified external cause status: Secondary | ICD-10-CM | POA: Insufficient documentation

## 2019-04-09 DIAGNOSIS — Z23 Encounter for immunization: Secondary | ICD-10-CM | POA: Insufficient documentation

## 2019-04-09 DIAGNOSIS — Z91040 Latex allergy status: Secondary | ICD-10-CM | POA: Insufficient documentation

## 2019-04-09 DIAGNOSIS — I10 Essential (primary) hypertension: Secondary | ICD-10-CM | POA: Insufficient documentation

## 2019-04-09 MED ORDER — TETANUS-DIPHTH-ACELL PERTUSSIS 5-2.5-18.5 LF-MCG/0.5 IM SUSP
0.5000 mL | Freq: Once | INTRAMUSCULAR | Status: AC
  Administered 2019-04-09: 0.5 mL via INTRAMUSCULAR
  Filled 2019-04-09: qty 0.5

## 2019-04-09 MED ORDER — BACITRACIN ZINC 500 UNIT/GM EX OINT: TOPICAL_OINTMENT | Freq: Once | CUTANEOUS | Status: AC

## 2019-04-09 MED ORDER — CEPHALEXIN 500 MG PO CAPS: 500.0000 mg | ORAL_CAPSULE | Freq: Four times a day (QID) | ORAL | 0 refills | Status: AC

## 2019-04-09 MED ORDER — LIDOCAINE-EPINEPHRINE (PF) 2 %-1:200000 IJ SOLN
10.0000 mL | Freq: Once | INTRAMUSCULAR | Status: AC
  Administered 2019-04-09: 10 mL via INTRADERMAL
  Filled 2019-04-09: qty 10

## 2019-04-09 NOTE — Discharge Instructions (Addendum)
Keep the wound clean and dry for the first 24 hours. After that you may gently clean the wound with soap and water. Make sure to pat dry the wound before covering it with any dressing. You can use topical antibiotic ointment and bandage. Ice and elevate for pain relief.   You can take Tylenol or Ibuprofen as directed for pain. You can alternate Tylenol and Ibuprofen every 4 hours for additional pain relief.   Take antibiotics as directed. Please take all of your antibiotics until finished.   Follow-up with referred hand doctor for the numbness of her hand.  Call his office tomorrow to arrange for an appointment.  He has been notified about you and will try to work you in.  Return to the Emergency Department, your primary care doctor, or the [facility] in 7-10 days for suture removal.   Monitor closely for any signs of infection. Return to the Emergency Department for any worsening redness/swelling of the area that begins to spread, drainage from the site, worsening pain, fever or any other worsening or concerning symptoms.

## 2019-04-09 NOTE — ED Notes (Signed)
Wound care with bacitracin and dsd.

## 2019-04-09 NOTE — ED Triage Notes (Signed)
Laceration to left palm. Large 1" lac with bleeding controlled.  ROM intact

## 2019-04-09 NOTE — ED Provider Notes (Signed)
[facility] EMERGENCY DEPARTMENT Provider Note   CSN: [id number] Arrival date & time: 04/09/19  1805     History Chief Complaint  Patient presents with  . Laceration    Leon Pena is a 54 y.o. male past with history of depression, hypertension who presents for evaluation of laceration to left palm that occurred about 4:30 PM.  He reports that he was using some copper wiring and states he was sitting on a chair when the chair broke and caused his hand sliced on a razor blade.  He states he is not on blood thinners.  He does not know when his last tetanus shot was.  He states he can move the hand with any difficulty but does have some numbness to the distal aspects of his fourth and fifth digit.  He denies any weakness.  The history is provided by the patient.       Past Medical History:  Diagnosis Date  . Depression   . Hypertension     Patient Active Problem List   Diagnosis Date Noted  . HTN (hypertension) 10/05/2011    Class: Chronic  . Kidney stones 10/05/2011    Class: Chronic  . 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    Class: Chronic  . Major depressive disorder, recurrent (HCC) 10/05/2011  . Generalized anxiety disorder 10/05/2011    Past Surgical History:  Procedure Laterality Date  . CHOLECYSTECTOMY    . KIDNEY SURGERY         History reviewed. No pertinent family history.  Social History   Tobacco Use  . Smoking status: Never Smoker  . Smokeless tobacco: Current User    Types: Snuff  Substance Use Topics  . Alcohol use: No  . Drug use: No    Home Medications Prior to Admission medications   Medication Sig Start Date End Date Taking? Authorizing Provider  cephALEXin (KEFLEX) 500 MG capsule Take 1 capsule (500 mg total) by mouth 4 (four) times daily for 7 days. 04/09/19 04/16/19  Maxwell Caul, PA-C  ondansetron (ZOFRAN) 4 MG tablet Take 1 tablet (4 mg total) by mouth every 6 (six) hours. 11/29/12   Ward, Layla Maw, DO    Allergies      Latex, Compazine, and Promethazine hcl  Review of Systems   Review of Systems  Skin: Positive for wound.  Neurological: Positive for numbness.  All other systems reviewed and are negative.   Physical Exam Updated Vital Signs BP 110/70 (BP Location: Right Arm)   Pulse 68   Temp 98.1 F (36.7 C) (Oral)   Resp 16   Ht 6' 3.5" (1.918 m)   Wt 99.8 kg   SpO2 98%   BMI 27.14 kg/m   Physical Exam Vitals and nursing note reviewed.  Constitutional:      Appearance: He is well-developed.  HENT:     Head: Normocephalic and atraumatic.  Eyes:     General: No scleral icterus.       Right eye: No discharge.        Left eye: No discharge.     Conjunctiva/sclera: Conjunctivae normal.  Cardiovascular:     Pulses:          Radial pulses are 2+ on the right side and 2+ on the left side.  Pulmonary:     Effort: Pulmonary effort is normal.  Musculoskeletal:     Comments: Flexion/tension of all 5 digits of left hand intact any difficulty.  He can fully make a fist with any  difficulty and fully extend his fingers.  Flexion/tension of the left wrist intact.  Skin:    General: Skin is warm and dry.     Capillary Refill: Capillary refill takes less than 2 seconds.     Comments: 3 cm curvilinear laceration noted to the lower aspect of his left hand just at the palmar crease. Good distal cap refill.  LUE is not dusky in appearance or cool to touch.  Neurological:     Mental Status: He is alert.     Comments: Patient reports decreased sensation noted circumferentially to the fourth and fifth digit as well as to the palmar and dorsal aspect of the area surrounding the laceration.  He regains full sensation towards the proximal hypothenar eminence and extending proximally.  Psychiatric:        Speech: Speech normal.        Behavior: Behavior normal.          ED Results / Procedures / Treatments   Labs (all labs ordered are listed, but only abnormal results are displayed) Labs Reviewed  - No data to display  EKG None  Radiology DG Hand Complete Left  Result Date: 04/09/2019 CLINICAL DATA:  Recent laceration with a saw, initial encounter EXAM: LEFT HAND - COMPLETE 3+ VIEW COMPARISON:  None. FINDINGS: Soft tissue irregularity is noted medially adjacent to the fifth metacarpal consistent with the given clinical history. No underlying bony abnormality is seen. No definitive radiopaque foreign body is seen. IMPRESSION: Soft tissue injury without acute bony abnormality. Electronically Signed   By: Alcide Clever M.D.   On: 04/09/2019 20:12    Procedures .Marland KitchenLaceration Repair  Date/Time: 04/09/2019 11:55 PM Performed by: Maxwell Caul, PA-C Authorized by: Maxwell Caul, PA-C   Consent:    Consent obtained:  Verbal   Consent given by:  Patient   Risks discussed:  Infection, need for additional repair, pain, poor cosmetic result and poor wound healing   Alternatives discussed:  No treatment and delayed treatment Universal protocol:    Procedure explained and questions answered to patient or proxy's satisfaction: yes     Relevant documents present and verified: yes     Test results available and properly labeled: yes     Imaging studies available: yes     Required blood products, implants, devices, and special equipment available: yes     Site/side marked: yes     Immediately prior to procedure, a time out was called: yes     Patient identity confirmed:  Verbally with patient Anesthesia (see MAR for exact dosages):    Anesthesia method:  Local infiltration   Local anesthetic:  Lidocaine 2% WITH epi Laceration details:    Location:  Hand   Hand location:  L palm   Length (cm):  3 Repair type:    Repair type:  Intermediate Pre-procedure details:    Preparation:  Patient was prepped and draped in usual sterile fashion Exploration:    Wound extent: no foreign bodies/material noted and no tendon damage noted   Treatment:    Area cleansed with:  Betadine   Amount of  cleaning:  Extensive   Irrigation solution:  Sterile saline   Irrigation method:  Syringe   Visualized foreign bodies/material removed: no   Skin repair:    Repair method:  Sutures   Suture size:  5-0   Suture material:  Nylon   Suture technique:  Simple interrupted   Number of sutures:  7 Approximation:    Approximation:  Close Post-procedure details:    Dressing:  Antibiotic ointment   Patient tolerance of procedure:  Tolerated well, no immediate complications   (including critical care time)  Medications Ordered in ED Medications  Tdap (BOOSTRIX) injection 0.5 mL (0.5 mLs Intramuscular Given 04/09/19 2034)  lidocaine-EPINEPHrine (XYLOCAINE W/EPI) 2 %-1:200000 (PF) injection 10 mL (10 mLs Intradermal Given 04/09/19 2034)  bacitracin ointment ( Topical Given 04/09/19 2300)    ED Course  I have reviewed the triage vital signs and the nursing notes.  Pertinent labs & imaging results that were available during my care of the patient were reviewed by me and considered in my medical decision making (see chart for details).    MDM Rules/Calculators/A&P                      54 year old male who presents for evaluation of laceration to left hand.  He reports that he was cutting a copper wire and states that the razor blade that he was using slipped and lacerated his hand.  He states it was a clean, new razor.  He does not know when his last tetanus shot was.  He does report some numbness to the fifth, fourth digits as well as the area surrounding laceration. Patient is afebrile, non-toxic appearing, sitting comfortably on examination table. Vital signs reviewed and stable.  Patient with good radial pulses.  Good distal cap refill.  We will plan for wound care, tetanus, x-ray.  X-ray reviewed.  No acute bony abnormality.  Laceration repaired as documented above.  Patient tolerated procedure well.  He still has some decreased sensation noted to the fourth and fifth digit as well as the palmar  aspect of the hand.  Will consult hand for outpatient follow-up.  Discussed patient with Dr. Jeannie Fend (hand).  He will plan to follow-up with patient in the office regarding his sensation deficits.  Given that this was on a razor and a copper wire, will plan to put him on antibiotics.  Patient with no known drug allergies. At this time, patient exhibits no emergent life-threatening condition that require further evaluation in ED or admission. Patient had ample opportunity for questions and discussion. All patient's questions were answered with full understanding. Strict return precautions discussed. Patient expresses understanding and agreement to plan.   Portions of this note were generated with Lobby[employer]. Dictation errors may occur despite best attempts at proofreading.  Final Clinical Impression(s) / ED Diagnoses Final diagnoses:  Laceration of left hand, foreign body presence unspecified, initial encounter    Rx / DC Orders ED Discharge Orders         Ordered    cephALEXin (KEFLEX) 500 MG capsule  4 times daily     04/09/19 2223           Volanda Napoleon, PA-C 04/09/19 2355    Little, Wenda Overland, MD 04/12/19 [phone]

## 2021-04-04 ENCOUNTER — Other Ambulatory Visit: Payer: Self-pay

## 2021-04-04 ENCOUNTER — Emergency Department (HOSPITAL_BASED_OUTPATIENT_CLINIC_OR_DEPARTMENT_OTHER): Payer: Self-pay

## 2021-04-04 ENCOUNTER — Encounter (HOSPITAL_BASED_OUTPATIENT_CLINIC_OR_DEPARTMENT_OTHER): Payer: Self-pay | Admitting: Emergency Medicine

## 2021-04-04 ENCOUNTER — Emergency Department (HOSPITAL_BASED_OUTPATIENT_CLINIC_OR_DEPARTMENT_OTHER)
Admission: EM | Admit: 2021-04-04 | Discharge: 2021-04-04 | Disposition: A | Discharge status: Home / Self Care | Payer: Self-pay | Attending: Student | Admitting: Student

## 2021-04-04 DIAGNOSIS — J069 Acute upper respiratory infection, unspecified: Secondary | ICD-10-CM | POA: Insufficient documentation

## 2021-04-04 DIAGNOSIS — F17290 Nicotine dependence, other tobacco product, uncomplicated: Secondary | ICD-10-CM | POA: Insufficient documentation

## 2021-04-04 DIAGNOSIS — I10 Essential (primary) hypertension: Secondary | ICD-10-CM | POA: Insufficient documentation

## 2021-04-04 DIAGNOSIS — J45909 Unspecified asthma, uncomplicated: Secondary | ICD-10-CM | POA: Insufficient documentation

## 2021-04-04 DIAGNOSIS — Z20822 Contact with and (suspected) exposure to covid-19: Secondary | ICD-10-CM | POA: Insufficient documentation

## 2021-04-04 LAB — RESP PANEL BY RT-PCR (FLU A&B, COVID) ARPGX2
Influenza A by PCR: NEGATIVE
Influenza B by PCR: NEGATIVE
SARS Coronavirus 2 by RT PCR: NEGATIVE

## 2021-04-04 MED ORDER — PREDNISONE 50 MG PO TABS
60.0000 mg | ORAL_TABLET | Freq: Once | ORAL | Status: AC
  Administered 2021-04-04: 60 mg via ORAL
  Filled 2021-04-04: qty 1

## 2021-04-04 MED ORDER — PREDNISONE 10 MG PO TABS: 40.0000 mg | ORAL_TABLET | Freq: Every day | ORAL | 0 refills | Status: DC

## 2021-04-04 MED ORDER — IPRATROPIUM-ALBUTEROL 0.5-2.5 (3) MG/3ML IN SOLN
3.0000 mL | Freq: Once | RESPIRATORY_TRACT | Status: AC
  Administered 2021-04-04: 3 mL via RESPIRATORY_TRACT
  Filled 2021-04-04: qty 3

## 2021-04-04 MED ORDER — ALBUTEROL SULFATE HFA 108 (90 BASE) MCG/ACT IN AERS
1.0000 | INHALATION_SPRAY | Freq: Once | RESPIRATORY_TRACT | Status: AC
  Administered 2021-04-04: 1 via RESPIRATORY_TRACT
  Filled 2021-04-04: qty 6.7

## 2021-04-04 NOTE — ED Triage Notes (Signed)
Pt states he has had cold symptoms for the past 3 weeks  Sxs include cough, wheezing, body aches, runny nose

## 2021-04-04 NOTE — ED Notes (Signed)
Pt discharged to home. Discharge instructions have been discussed with patient and/or family members. Pt verbally acknowledges understanding d/c instructions, and endorses comprehension to checkout at registration before leaving.  °

## 2021-04-04 NOTE — ED Notes (Signed)
X-ray at bedside

## 2021-04-04 NOTE — Progress Notes (Signed)
MDI and spacer given to patient to take home. Medication not administered at this time.

## 2021-04-04 NOTE — ED Provider Notes (Signed)
[facility] EMERGENCY DEPARTMENT Provider Note  CSN: [id number] Arrival date & time: 04/04/21 9629  Chief Complaint(s) URI  HPI Leon Pena is a 56 y.o. male with PMH HTN who presents emergency department for evaluation of a cough and wheezing.  Patient states that he has had flulike symptoms for approximately 3 weeks but feels that his cough and wheezing have worsened over the last 72 hours.  He arrives with his mother who is here for a different complaint.  Denies chest pain, shortness of breath, abdominal pain, nausea, vomiting or other systemic symptoms.   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 Authorizing Provider  predniSONE (DELTASONE) 10 MG tablet Take 4 tablets (40 mg total) by mouth daily. 04/04/21  Yes Mattalynn Crandle, MD  ondansetron (ZOFRAN) 4 MG tablet Take 1 tablet (4 mg total) by mouth every 6 (six) hours. 11/29/12   Ward, Layla Maw, DO                                                                                                                                    Past Surgical History Past Surgical History:  Procedure Laterality Date   CHOLECYSTECTOMY     HAND SURGERY     head fracture surgery     KIDNEY SURGERY     Family History No family history on file.  Social History Social History   Tobacco Use   Smoking status: Never   Smokeless tobacco: Current    Types: Snuff  Vaping Use   Vaping Use: Never used  Substance Use Topics    Alcohol use: No   Drug use: No   Allergies Latex, Compazine, and Promethazine hcl  Review of Systems Review of Systems  Respiratory:  Positive for cough and wheezing.    Physical Exam Vital Signs  I have reviewed the triage vital signs BP 127/85 (BP Location: Right Arm)    Pulse 64    Temp (!) 97.5 F (36.4 C) (Oral)    Resp 18    Ht 6\' 4"  (1.93 m)    Wt 112.5 kg    SpO2 97%    BMI 30.19 kg/m   Physical Exam Vitals and nursing note reviewed.  Constitutional:      General: He is not in acute distress.    Appearance: He is well-developed.  HENT:     Head: Normocephalic and  atraumatic.  Eyes:     Conjunctiva/sclera: Conjunctivae normal.  Cardiovascular:     Rate and Rhythm: Normal rate and regular rhythm.     Heart sounds: No murmur heard. Pulmonary:     Effort: Pulmonary effort is normal. No respiratory distress.     Breath sounds: Wheezing present.  Abdominal:     Palpations: Abdomen is soft.     Tenderness: There is no abdominal tenderness.  Musculoskeletal:        General: No swelling.     Cervical back: Neck supple.  Skin:    General: Skin is warm and dry.     Capillary Refill: Capillary refill takes less than 2 seconds.  Neurological:     Mental Status: He is alert.  Psychiatric:        Mood and Affect: Mood normal.    ED Results and Treatments Labs (all labs ordered are listed, but only abnormal results are displayed) Labs Reviewed  RESP PANEL BY RT-PCR (FLU A&B, COVID) ARPGX2                                                                                                                          Radiology DG Chest Portable 1 View  Result Date: 04/04/2021 CLINICAL DATA:  Rule out pneumonia EXAM: PORTABLE CHEST 1 VIEW COMPARISON:  11/29/2012 FINDINGS: Low volume chest with underpenetration at the bases. There is no edema, consolidation, effusion, or pneumothorax. Normal heart size and mediastinal contours. IMPRESSION: Negative low volume chest.  Electronically Signed   By: Tiburcio Pea M.D.   On: 04/04/2021 07:37    Pertinent labs & imaging results that were available during my care of the patient were reviewed by me and considered in my medical decision making (see MDM for details).  Medications Ordered in ED Medications  albuterol (VENTOLIN HFA) 108 (90 Base) MCG/ACT inhaler 1 puff (has no administration in time range)  ipratropium-albuterol (DUONEB) 0.5-2.5 (3) MG/3ML nebulizer solution 3 mL (3 mLs Nebulization Given 04/04/21 0718)  predniSONE (DELTASONE) tablet 60 mg (60 mg Oral Given 04/04/21 0813)                                                                                                                                     Procedures Procedures  (including critical care time)  Medical Decision Making / ED Course   This patient presents to the ED for concern  of cough, wheezing, this involves an extensive number of treatment options, and is a complaint that carries with it a high risk of complications and morbidity.  The differential diagnosis includes influenza, COVID-19, viral URI, reactive airway disease, COPD, asthma, pneumonia  MDM: Patient seen emergency department evaluation of cough and wheezing.  Physical exam with bilateral and expiratory wheezing but is otherwise unremarkable.  Chest x-ray with no pneumonia.  COVID and flu negative.  Patient given single DuoNeb and 60 of prednisone and wheezing resolved.  Patient states symptoms have improved.  Patient given albuterol inhaler and was discharged on 4 additional days of steroids.   Additional history obtained: -Additional history obtained from mother -External records from outside source obtained and reviewed including: Chart review including previous notes, labs, imaging, consultation notes   Lab Tests: -I ordered, reviewed, and interpreted labs.   The pertinent results include:   Labs Reviewed  RESP PANEL BY RT-PCR (FLU A&B, COVID) ARPGX2    Imaging  Studies ordered: I ordered imaging studies including CXR I independently visualized and interpreted imaging. I agree with the radiologist interpretation   Medicines ordered and prescription drug management: Meds ordered this encounter  Medications   ipratropium-albuterol (DUONEB) 0.5-2.5 (3) MG/3ML nebulizer solution 3 mL   predniSONE (DELTASONE) tablet 60 mg   albuterol (VENTOLIN HFA) 108 (90 Base) MCG/ACT inhaler 1 puff   predniSONE (DELTASONE) 10 MG tablet    Sig: Take 4 tablets (40 mg total) by mouth daily.    Dispense:  16 tablet    Refill:  0    -I have reviewed the patients home medicines and have made adjustments as needed  Critical interventions none   Social Determinants of Health:  Factors impacting patients care include: none   Reevaluation: After the interventions noted above, I reevaluated the patient and found that they have :improved  Co morbidities that complicate the patient evaluation  Past Medical History:  Diagnosis Date   Depression    Hypertension       Dispostion: I considered admission for this patient, but his symptoms improved with interventions here in the emergency department he is safe for outpatient follow-up     Final Clinical Impression(s) / ED Diagnoses Final diagnoses:  Viral URI with cough  Reactive airway disease without complication, unspecified asthma severity, unspecified whether persistent     @PCDICTATION @    Nathanyal Ashmead, , MD 04/04/21 [phone]

## 2021-07-27 ENCOUNTER — Emergency Department (HOSPITAL_BASED_OUTPATIENT_CLINIC_OR_DEPARTMENT_OTHER): Payer: Self-pay

## 2021-07-27 ENCOUNTER — Emergency Department (HOSPITAL_BASED_OUTPATIENT_CLINIC_OR_DEPARTMENT_OTHER)
Admission: EM | Admit: 2021-07-27 | Discharge: 2021-07-27 | Disposition: A | Discharge status: Home / Self Care | Payer: Self-pay | Attending: Emergency Medicine | Admitting: Emergency Medicine

## 2021-07-27 ENCOUNTER — Other Ambulatory Visit: Payer: Self-pay

## 2021-07-27 ENCOUNTER — Encounter (HOSPITAL_BASED_OUTPATIENT_CLINIC_OR_DEPARTMENT_OTHER): Payer: Self-pay | Admitting: Urology

## 2021-07-27 DIAGNOSIS — R319 Hematuria, unspecified: Secondary | ICD-10-CM

## 2021-07-27 DIAGNOSIS — N1330 Unspecified hydronephrosis: Secondary | ICD-10-CM | POA: Insufficient documentation

## 2021-07-27 DIAGNOSIS — M1711 Unilateral primary osteoarthritis, right knee: Secondary | ICD-10-CM | POA: Insufficient documentation

## 2021-07-27 LAB — COMPREHENSIVE METABOLIC PANEL
ALT: 22 U/L (ref 0–44)
AST: 21 U/L (ref 15–41)
Albumin: 4.5 g/dL (ref 3.5–5.0)
Alkaline Phosphatase: 47 U/L (ref 38–126)
Anion gap: 9 (ref 5–15)
BUN: 14 mg/dL (ref 6–20)
CO2: 22 mmol/L (ref 22–32)
Calcium: 9.5 mg/dL (ref 8.9–10.3)
Chloride: 106 mmol/L (ref 98–111)
Creatinine, Ser: 1.04 mg/dL (ref 0.61–1.24)
GFR, Estimated: >60 mL/min (ref >60)
Glucose, Bld: 91 mg/dL (ref 70–99)
Potassium: 3.7 mmol/L (ref 3.5–5.1)
Sodium: 137 mmol/L (ref 135–145)
Total Bilirubin: 0.6 mg/dL (ref 0.3–1.2)
Total Protein: 7.4 g/dL (ref 6.5–8.1)

## 2021-07-27 LAB — URINALYSIS, ROUTINE W REFLEX MICROSCOPIC
Bilirubin Urine: NEGATIVE
Glucose, UA: NEGATIVE mg/dL
Ketones, ur: NEGATIVE mg/dL
Nitrite: NEGATIVE
Protein, ur: 100 mg/dL — AB
Specific Gravity, Urine: 1.025 (ref 1.005–1.030)
pH: 5.5 (ref 5.0–8.0)

## 2021-07-27 LAB — URINALYSIS, MICROSCOPIC (REFLEX): RBC / HPF: >50 RBC/hpf (ref 0–5)

## 2021-07-27 LAB — CBC
HCT: 43.5 % (ref 39.0–52.0)
Hemoglobin: 15.4 g/dL (ref 13.0–17.0)
MCH: 30.1 pg (ref 26.0–34.0)
MCHC: 35.4 g/dL (ref 30.0–36.0)
MCV: 85.1 fL (ref 80.0–100.0)
Platelets: 272 10*3/uL (ref 150–400)
RBC: 5.11 MIL/uL (ref 4.22–5.81)
RDW: 12.2 % (ref 11.5–15.5)
WBC: 9.2 10*3/uL (ref 4.0–10.5)
nRBC: 0 % (ref 0.0–0.2)

## 2021-07-27 MED ORDER — OXYCODONE HCL 5 MG PO TABS: 5.0000 mg | ORAL_TABLET | Freq: Three times a day (TID) | ORAL | 0 refills | Status: DC | PRN

## 2021-07-27 NOTE — ED Notes (Signed)
Pt transported to CT ?

## 2021-07-27 NOTE — ED Triage Notes (Signed)
Pt feels like he "may have passed some kidney stones last week"  states had blood in urine  H/o of one kidney  States burning with urination and left sided flank pain  Right knee pain since Friday as well

## 2021-07-27 NOTE — ED Notes (Signed)
D/c paperwork reviewed with pt, including prescription. Pt provided copy of lab and imaging results, per his request. All questions addressed prior to d/c. Pt ambulatory to ED exit without assistance.

## 2021-07-27 NOTE — ED Provider Notes (Signed)
[facility] EMERGENCY DEPARTMENT Provider Note   CSN: [id number] Arrival date & time: 07/27/21  1707     History {Add pertinent medical, surgical, social history, OB history to HPI:1} Chief Complaint  Patient presents with   Flank Pain   Knee Pain    Leon Pena is a 56 y.o. male presenting to the ED with flank pain and knee pain.  Patient reports onset of left-sided flank pain about 5 days ago, noted dysuria and hematuria.  History of kidney stones.  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  He has had obstructive kidney stones requiring nephrostomy tubes in the past.  He denies fevers or chills.  He reports he has been having pain in his right knee, but wonders if this is because he has been favoring this leg due to his back pain.  He has been taking Tylenol regularly at home, 4 tablets a day, tries to avoid NSAIDs because of his single kidney.  He does not drink alcohol.    HPI     Home Medications Prior to Admission medications   Medication Sig Start Date End Date Taking? Authorizing Provider  ondansetron (ZOFRAN) 4 MG tablet Take 1 tablet (4 mg total) by mouth every 6 (six) hours. 11/29/12   Ward, Layla Maw, DO  predniSONE (DELTASONE) 10 MG tablet Take 4 tablets (40 mg total) by mouth daily. 04/04/21   Kommor, [place], MD      Allergies    Latex, Compazine, and Promethazine hcl    Review of Systems   Review of Systems  Physical Exam Updated Vital Signs BP (!) 151/90 (BP Location: Left Arm)   Pulse 71   Temp 98.3 F (36.8 C) (Oral)   Resp 17   Ht 6\' 3"  (1.905 m)   Wt 115.7 kg   SpO2 98%   BMI 31.87 kg/m  Physical Exam Constitutional:      General: He is not in acute distress. HENT:     Head: Normocephalic and atraumatic.  Eyes:     Conjunctiva/sclera: Conjunctivae normal.     Pupils: Pupils are equal, round, and reactive to light.  Cardiovascular:     Rate and Rhythm: Normal rate and  regular rhythm.  Pulmonary:     Effort: Pulmonary effort is normal. No respiratory distress.  Abdominal:     General: There is no distension.     Tenderness: There is no abdominal tenderness.  Skin:    General: Skin is warm and dry.  Neurological:     General: No focal deficit present.     Mental Status: He is alert and oriented to person, place, and time. Mental status is at baseline.  Psychiatric:        Mood and Affect: Mood normal.        Behavior: Behavior normal.     ED Results / Procedures / Treatments   Labs (all labs ordered are listed, but only abnormal results are displayed) Labs Reviewed  URINALYSIS, ROUTINE W REFLEX MICROSCOPIC - Abnormal; Notable for the following components:      Result Value   Color, Urine STRAW (*)    APPearance CLOUDY (*)    Hgb urine dipstick LARGE (*)    Protein, ur 100 (*)    Leukocytes,Ua TRACE (*)    All other components within normal limits  URINALYSIS, MICROSCOPIC (REFLEX) - Abnormal; Notable for the following components:   Bacteria, UA RARE (*)  All other components within normal limits    EKG None  Radiology DG Knee Complete 4 Views Right  Result Date: 07/27/2021 CLINICAL DATA:  Right knee pain. EXAM: RIGHT KNEE - COMPLETE 4+ VIEW COMPARISON:  Right knee radiographs 06/18/2006 FINDINGS: Mild medial compartment joint space narrowing and mild peripheral degenerative osteophytosis. Mild superior patellar degenerative spurring. No joint effusion. No acute fracture or dislocation. IMPRESSION: Mild medial compartment osteoarthritis, new from remote 06/18/2006 prior radiographs. Electronically Signed   By: Neita Garnet M.D.   On: 07/27/2021 17:51    Procedures Procedures  {Document cardiac monitor, telemetry assessment procedure when appropriate:1}  Medications Ordered in ED Medications - No data to display  ED Course/ Medical Decision Making/ A&P                           Medical Decision Making Amount and/or Complexity of  Data Reviewed Labs: ordered. Radiology: ordered.   This patient presents to the ED with concern for flank pain, knee pain. This involves an extensive number of treatment options, and is a complaint that carries with it a high risk of complications and morbidity.  The differential diagnosis includes knee pain I suspect is related to osteoarthritis, consistent with the x-rays obtained here.  I have a low suspicion for traumatic fracture or septic joint.  This flank pain is consistent with ureteral colic and a persistent stone.  UA shows hemoglobin here.  Because he is high risk for renal injury due to a single kidney, we will obtain a CT scan as well as check his creatinine level.  I do not see evidence of infection on the UA  Co-morbidities that complicate the patient evaluation: Single kidney  I ordered and personally interpreted labs.  The pertinent results include:  ***  I ordered imaging studies including CT renal study I independently visualized and interpreted imaging which showed *** I agree with the radiologist interpretation  The patient was maintained on a cardiac monitor.  I personally viewed and interpreted the cardiac monitored which showed an underlying rhythm of: ***  Per my interpretation the patient's ECG shows ***  I ordered medication including ***  for *** I have reviewed the patients home medicines and have made adjustments as needed  Test Considered: ***  I requested consultation with the ***,  and discussed lab and imaging findings as well as pertinent plan - they recommend: ***  After the interventions noted above, I reevaluated the patient and found that they have: {resolved/improved/worsened:23923::"improved"}  Social Determinants of Health:***  Dispostion:  After consideration of the diagnostic results and the patients response to treatment, I feel that the patent would benefit from ***.   {Document critical care time when appropriate:1} {Document  review of labs and clinical decision tools ie heart score, Chads2Vasc2 etc:1}  {Document your independent review of radiology images, and any outside records:1} {Document your discussion with family members, caretakers, and with consultants:1} {Document social determinants of health affecting pt's care:1} {Document your decision making why or why not admission, treatments were needed:1} Final Clinical Impression(s) / ED Diagnoses Final diagnoses:  None    Rx / DC Orders ED Discharge Orders     None

## 2021-07-27 NOTE — ED Notes (Signed)
Pt provided chips, crackers, and soda.  EDP Trifan aware and agreeable.

## 2021-07-27 NOTE — Discharge Instructions (Addendum)
Please call the urology office tomorrow to schedule follow-up appoint in the office.  We talked about your hydronephrosis.  Included copies of your images reports for your own records at home.  If you begin to have difficulty urinating, do not produce any urine after 12 or 24 hours, or have worsening flank pain, or difficulty breathing or leg swelling, come back to the ER.

## 2022-02-18 DIAGNOSIS — K1321 Leukoplakia of oral mucosa, including tongue: Secondary | ICD-10-CM | POA: Diagnosis not present

## 2022-03-11 DIAGNOSIS — Z419 Encounter for procedure for purposes other than remedying health state, unspecified: Secondary | ICD-10-CM | POA: Diagnosis not present

## 2022-04-09 DIAGNOSIS — Z419 Encounter for procedure for purposes other than remedying health state, unspecified: Secondary | ICD-10-CM | POA: Diagnosis not present

## 2022-05-10 DIAGNOSIS — Z419 Encounter for procedure for purposes other than remedying health state, unspecified: Secondary | ICD-10-CM | POA: Diagnosis not present

## 2022-06-09 DIAGNOSIS — Z419 Encounter for procedure for purposes other than remedying health state, unspecified: Secondary | ICD-10-CM | POA: Diagnosis not present

## 2022-07-10 DIAGNOSIS — Z419 Encounter for procedure for purposes other than remedying health state, unspecified: Secondary | ICD-10-CM | POA: Diagnosis not present

## 2022-07-26 ENCOUNTER — Emergency Department (HOSPITAL_BASED_OUTPATIENT_CLINIC_OR_DEPARTMENT_OTHER): Payer: Medicaid Other | Admitting: Radiology

## 2022-07-26 ENCOUNTER — Emergency Department (HOSPITAL_BASED_OUTPATIENT_CLINIC_OR_DEPARTMENT_OTHER)
Admission: EM | Admit: 2022-07-26 | Discharge: 2022-07-26 | Disposition: A | Discharge status: Home / Self Care | Payer: Medicaid Other | Attending: Emergency Medicine | Admitting: Emergency Medicine

## 2022-07-26 ENCOUNTER — Encounter (HOSPITAL_BASED_OUTPATIENT_CLINIC_OR_DEPARTMENT_OTHER): Payer: Self-pay | Admitting: Emergency Medicine

## 2022-07-26 ENCOUNTER — Other Ambulatory Visit: Payer: Self-pay

## 2022-07-26 ENCOUNTER — Emergency Department (HOSPITAL_BASED_OUTPATIENT_CLINIC_OR_DEPARTMENT_OTHER): Payer: Medicaid Other

## 2022-07-26 DIAGNOSIS — R519 Headache, unspecified: Secondary | ICD-10-CM | POA: Insufficient documentation

## 2022-07-26 DIAGNOSIS — R0602 Shortness of breath: Secondary | ICD-10-CM | POA: Diagnosis not present

## 2022-07-26 DIAGNOSIS — R9431 Abnormal electrocardiogram [ECG] [EKG]: Secondary | ICD-10-CM | POA: Diagnosis not present

## 2022-07-26 DIAGNOSIS — K219 Gastro-esophageal reflux disease without esophagitis: Secondary | ICD-10-CM | POA: Diagnosis not present

## 2022-07-26 LAB — CBC WITH DIFFERENTIAL/PLATELET
Abs Immature Granulocytes: 0.03 10*3/uL (ref 0.00–0.07)
Basophils Absolute: 0.1 10*3/uL (ref 0.0–0.1)
Basophils Relative: 1 %
Eosinophils Absolute: 0.2 10*3/uL (ref 0.0–0.5)
Eosinophils Relative: 3 %
HCT: 44.2 % (ref 39.0–52.0)
Hemoglobin: 15.3 g/dL (ref 13.0–17.0)
Immature Granulocytes: 0 %
Lymphocytes Relative: 23 %
Lymphs Abs: 1.5 10*3/uL (ref 0.7–4.0)
MCH: 29.9 pg (ref 26.0–34.0)
MCHC: 34.6 g/dL (ref 30.0–36.0)
MCV: 86.5 fL (ref 80.0–100.0)
Monocytes Absolute: 0.5 10*3/uL (ref 0.1–1.0)
Monocytes Relative: 7 %
Neutro Abs: 4.4 10*3/uL (ref 1.7–7.7)
Neutrophils Relative %: 66 %
Platelets: 293 10*3/uL (ref 150–400)
RBC: 5.11 MIL/uL (ref 4.22–5.81)
RDW: 12 % (ref 11.5–15.5)
WBC: 6.7 10*3/uL (ref 4.0–10.5)
nRBC: 0 % (ref 0.0–0.2)

## 2022-07-26 LAB — COMPREHENSIVE METABOLIC PANEL
ALT: 21 U/L (ref 0–44)
AST: 23 U/L (ref 15–41)
Albumin: 4.8 g/dL (ref 3.5–5.0)
Alkaline Phosphatase: 46 U/L (ref 38–126)
Anion gap: 9 (ref 5–15)
BUN: 12 mg/dL (ref 6–20)
CO2: 25 mmol/L (ref 22–32)
Calcium: 10.4 mg/dL — ABNORMAL HIGH (ref 8.9–10.3)
Chloride: 102 mmol/L (ref 98–111)
Creatinine, Ser: 1.04 mg/dL (ref 0.61–1.24)
GFR, Estimated: >60 mL/min (ref >60)
Glucose, Bld: 105 mg/dL — ABNORMAL HIGH (ref 70–99)
Potassium: 4.1 mmol/L (ref 3.5–5.1)
Sodium: 136 mmol/L (ref 135–145)
Total Bilirubin: 0.6 mg/dL (ref 0.3–1.2)
Total Protein: 7.3 g/dL (ref 6.5–8.1)

## 2022-07-26 MED ORDER — MAGNESIUM SULFATE 2 GM/50ML IV SOLN
2.0000 g | Freq: Once | INTRAVENOUS | Status: AC
  Administered 2022-07-26: 2 g via INTRAVENOUS
  Filled 2022-07-26: qty 50

## 2022-07-26 MED ORDER — ALUM & MAG HYDROXIDE-SIMETH 200-200-20 MG/5ML PO SUSP
30.0000 mL | Freq: Once | ORAL | Status: AC
  Administered 2022-07-26: 30 mL via ORAL
  Filled 2022-07-26: qty 30

## 2022-07-26 MED ORDER — METOCLOPRAMIDE HCL 5 MG/ML IJ SOLN
10.0000 mg | Freq: Once | INTRAMUSCULAR | Status: AC
  Administered 2022-07-26: 10 mg via INTRAVENOUS
  Filled 2022-07-26: qty 2

## 2022-07-26 MED ORDER — DIPHENHYDRAMINE HCL 50 MG/ML IJ SOLN
25.0000 mg | Freq: Once | INTRAMUSCULAR | Status: AC
  Administered 2022-07-26: 25 mg via INTRAVENOUS
  Filled 2022-07-26: qty 1

## 2022-07-26 MED ORDER — SODIUM CHLORIDE 0.9 % IV BOLUS
1000.0000 mL | Freq: Once | INTRAVENOUS | Status: AC
  Administered 2022-07-26: 1000 mL via INTRAVENOUS

## 2022-07-26 MED ORDER — LIDOCAINE VISCOUS HCL 2 % MT SOLN
15.0000 mL | Freq: Once | OROMUCOSAL | Status: AC
  Administered 2022-07-26: 15 mL via ORAL
  Filled 2022-07-26: qty 15

## 2022-07-26 NOTE — ED Triage Notes (Signed)
Pt arrives to ED with c/o SOB, headaches, heartburn x2 weeks.

## 2022-07-26 NOTE — Discharge Instructions (Addendum)
I recommend taking Pepcid daily if you are continuing to have acid reflux symptoms.  May want to try a daily Zyrtec, or Allegra given the sinus congestion on your CT.  Otherwise your workup today was reassuring, and I am glad that your headache is improved.
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
## 2022-07-26 NOTE — ED Notes (Signed)
Discharge paperwork given and verbally understood. 

## 2022-07-26 NOTE — ED Notes (Signed)
ED Provider at bedside. 

## 2022-08-09 DIAGNOSIS — Z419 Encounter for procedure for purposes other than remedying health state, unspecified: Secondary | ICD-10-CM | POA: Diagnosis not present

## 2022-09-09 DIAGNOSIS — Z419 Encounter for procedure for purposes other than remedying health state, unspecified: Secondary | ICD-10-CM | POA: Diagnosis not present

## 2022-10-10 DIAGNOSIS — Z419 Encounter for procedure for purposes other than remedying health state, unspecified: Secondary | ICD-10-CM | POA: Diagnosis not present

## 2022-11-09 DIAGNOSIS — Z419 Encounter for procedure for purposes other than remedying health state, unspecified: Secondary | ICD-10-CM | POA: Diagnosis not present

## 2022-11-12 ENCOUNTER — Encounter: Payer: Self-pay | Admitting: Nurse Practitioner

## 2022-11-12 ENCOUNTER — Ambulatory Visit (INDEPENDENT_AMBULATORY_CARE_PROVIDER_SITE_OTHER): Payer: BC Managed Care – PPO | Admitting: Nurse Practitioner

## 2022-11-12 VITALS — BP 124/76 | HR 76 | Temp 97.4°F | Ht 75.0 in | Wt 251.6 lb

## 2022-11-12 DIAGNOSIS — M2550 Pain in unspecified joint: Secondary | ICD-10-CM

## 2022-11-12 DIAGNOSIS — R5382 Chronic fatigue, unspecified: Secondary | ICD-10-CM

## 2022-11-12 DIAGNOSIS — Z1211 Encounter for screening for malignant neoplasm of colon: Secondary | ICD-10-CM

## 2022-11-12 DIAGNOSIS — Z905 Acquired absence of kidney: Secondary | ICD-10-CM | POA: Diagnosis not present

## 2022-11-12 DIAGNOSIS — I10 Essential (primary) hypertension: Secondary | ICD-10-CM | POA: Diagnosis not present

## 2022-11-12 DIAGNOSIS — F5101 Primary insomnia: Secondary | ICD-10-CM | POA: Diagnosis not present

## 2022-11-12 DIAGNOSIS — N400 Enlarged prostate without lower urinary tract symptoms: Secondary | ICD-10-CM | POA: Diagnosis not present

## 2022-11-12 DIAGNOSIS — N200 Calculus of kidney: Secondary | ICD-10-CM | POA: Diagnosis not present

## 2022-11-12 LAB — HM HEPATITIS C SCREENING LAB: HM Hepatitis Screen: NEGATIVE

## 2022-11-12 LAB — HM HIV SCREENING LAB: HM HIV Screening: NEGATIVE

## 2022-11-12 MED ORDER — TADALAFIL 5 MG PO TABS: 5.0000 mg | ORAL_TABLET | Freq: Every day | ORAL | 1 refills | Status: DC
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
## 2022-11-12 NOTE — Patient Instructions (Signed)
It was great to see you!  We are checking your labs today and will let you know the results via mychart/phone.   You can try an Ashwagandha supplement to see if this helps with sleep.   I have refilled your cialis to the pharmacy.   Let's follow-up in 3 months, sooner if you have concerns.  If a referral was placed today, you will be contacted for an appointment. Please note that routine referrals can sometimes take up to 3-4 weeks to process. Please call our office if you haven't heard anything after this time frame.  Take care,  Rodman Pickle, NP

## 2022-11-14 ENCOUNTER — Encounter: Payer: Self-pay | Admitting: Nurse Practitioner

## 2022-11-14 NOTE — Assessment & Plan Note (Signed)
Chronic, stable. BP today 124/76 without medication. He did have an episode 3 weeks ago of BP 160s/90s. Encouraged him to continue limiting salt and checking his BP at home.

## 2022-11-14 NOTE — Assessment & Plan Note (Signed)
He has a history of kidney stones. He thinks he may be passing some now as his urine is "forked." He denies abdominal/flank pain.

## 2022-11-14 NOTE — Assessment & Plan Note (Signed)
Chronic, not controlled. 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 He is not interested in sleep medication as he needs to be able to get up if his mom needs him. He can try ashwaganha to see if this helps with sleep.

## 2022-11-14 NOTE — Assessment & Plan Note (Signed)
Chronic, stable. Continue cialis 5mg  daily. Refill sent to the pharmacy.

## 2022-11-14 NOTE — Assessment & Plan Note (Signed)
History of nephrectomy. Check CMP today.

## 2022-11-15 ENCOUNTER — Telehealth: Payer: Self-pay | Admitting: Nurse Practitioner

## 2022-11-15 LAB — ANA W/REFLEX: Anti Nuclear Antibody (ANA): NEGATIVE

## 2022-11-15 NOTE — Telephone Encounter (Signed)
Pt is wanting a cb concerning his most recent labs. Please advise pt at [phone]

## 2022-11-16 ENCOUNTER — Encounter: Payer: Self-pay | Admitting: Nurse Practitioner

## 2022-11-16 LAB — B. BURGDORFI ANTIBODIES: B burgdorferi Ab IgG+IgM: <0.9 {index}

## 2022-11-16 LAB — CBC WITH DIFFERENTIAL/PLATELET
Absolute Monocytes: 637 {cells}/uL (ref 200–950)
Basophils Absolute: 82 {cells}/uL (ref 0–200)
Basophils Relative: 0.9 %
Eosinophils Absolute: 200 {cells}/uL (ref 15–500)
Eosinophils Relative: 2.2 %
HCT: 47.7 % (ref 38.5–50.0)
Hemoglobin: 16.1 g/dL (ref 13.2–17.1)
Lymphs Abs: 2002 {cells}/uL (ref 850–3900)
MCH: 29.7 pg (ref 27.0–33.0)
MCHC: 33.8 g/dL (ref 32.0–36.0)
MCV: 87.8 fL (ref 80.0–100.0)
MPV: 10.5 fL (ref 7.5–12.5)
Monocytes Relative: 7 %
Neutro Abs: 6179 {cells}/uL (ref 1500–7800)
Neutrophils Relative %: 67.9 %
Platelets: 323 10*3/uL (ref 140–400)
RBC: 5.43 10*6/uL (ref 4.20–5.80)
RDW: 12.5 % (ref 11.0–15.0)
Total Lymphocyte: 22 %
WBC: 9.1 10*3/uL (ref 3.8–10.8)

## 2022-11-16 LAB — IRON,TIBC AND FERRITIN PANEL
%SAT: 17 % — ABNORMAL LOW (ref 20–48)
Ferritin: 32 ng/mL — ABNORMAL LOW (ref 38–380)
Iron: 62 ug/dL (ref 50–180)
TIBC: 358 ug/dL (ref 250–425)

## 2022-11-16 LAB — RHEUMATOID FACTOR: Rheumatoid fact SerPl-aCnc: <10 [IU]/mL (ref <14)

## 2022-11-16 LAB — COMPREHENSIVE METABOLIC PANEL
AG Ratio: 2 (calc) (ref 1.0–2.5)
ALT: 18 U/L (ref 9–46)
AST: 16 U/L (ref 10–35)
Albumin: 4.7 g/dL (ref 3.6–5.1)
Alkaline phosphatase (APISO): 52 U/L (ref 35–144)
BUN: 13 mg/dL (ref 7–25)
CO2: 23 mmol/L (ref 20–32)
Calcium: 10.1 mg/dL (ref 8.6–10.3)
Chloride: 105 mmol/L (ref 98–110)
Creat: 1 mg/dL (ref 0.70–1.30)
Globulin: 2.4 g/dL (ref 1.9–3.7)
Glucose, Bld: 89 mg/dL (ref 65–99)
Potassium: 4.1 mmol/L (ref 3.5–5.3)
Sodium: 138 mmol/L (ref 135–146)
Total Bilirubin: 0.5 mg/dL (ref 0.2–1.2)
Total Protein: 7.1 g/dL (ref 6.1–8.1)

## 2022-11-16 LAB — ROCKY MTN SPOTTED FVR ABS PNL(IGG+IGM)
RMSF IgG: NOT DETECTED
RMSF IgM: NOT DETECTED

## 2022-11-16 LAB — LIPID PANEL
Cholesterol: 177 mg/dL (ref <200)
HDL: 48 mg/dL (ref >=40)
LDL Cholesterol (Calc): 100 mg/dL — ABNORMAL HIGH
Non-HDL Cholesterol (Calc): 129 mg/dL (ref <130)
Total CHOL/HDL Ratio: 3.7 (calc) (ref <5.0)
Triglycerides: 195 mg/dL — ABNORMAL HIGH (ref <150)

## 2022-11-16 LAB — TSH: TSH: 1.65 m[IU]/L (ref 0.40–4.50)

## 2022-11-16 LAB — VITAMIN D 25 HYDROXY (VIT D DEFICIENCY, FRACTURES): Vit D, 25-Hydroxy: 33 ng/mL (ref 30–100)

## 2022-11-16 LAB — VITAMIN B12: Vitamin B-12: 760 pg/mL (ref 200–1100)

## 2022-11-16 LAB — PSA: PSA: 0.82 ng/mL (ref <=4.00)

## 2022-11-16 NOTE — Telephone Encounter (Signed)
Results discussed with patient and copy mailed and documented under lab section.

## 2022-12-10 DIAGNOSIS — Z419 Encounter for procedure for purposes other than remedying health state, unspecified: Secondary | ICD-10-CM | POA: Diagnosis not present

## 2022-12-20 ENCOUNTER — Ambulatory Visit
Admission: RE | Admit: 2022-12-20 | Discharge: 2022-12-20 | Disposition: A | Discharge status: Home / Self Care | Payer: BC Managed Care – PPO | Source: Ambulatory Visit | Attending: Nurse Practitioner | Admitting: Nurse Practitioner

## 2023-01-03 ENCOUNTER — Ambulatory Visit: Payer: BC Managed Care – PPO | Admitting: Family Medicine

## 2023-01-03 ENCOUNTER — Telehealth: Payer: Self-pay | Admitting: Nurse Practitioner

## 2023-01-03 NOTE — Telephone Encounter (Signed)
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 She is not doing well today.

## 2023-01-04 NOTE — Telephone Encounter (Signed)
1st missed visit, pt rescheduled for 01/20/2023, letter sent via MyChart

## 2023-01-05 NOTE — Telephone Encounter (Signed)
Noted  

## 2023-01-09 DIAGNOSIS — Z419 Encounter for procedure for purposes other than remedying health state, unspecified: Secondary | ICD-10-CM | POA: Diagnosis not present

## 2023-01-20 ENCOUNTER — Encounter: Payer: Self-pay | Admitting: Nurse Practitioner

## 2023-01-20 ENCOUNTER — Ambulatory Visit: Payer: BC Managed Care – PPO | Admitting: Nurse Practitioner

## 2023-01-20 VITALS — BP 152/80 | HR 62 | Temp 97.3°F | Ht 75.0 in | Wt 258.6 lb

## 2023-01-20 DIAGNOSIS — I10 Essential (primary) hypertension: Secondary | ICD-10-CM | POA: Diagnosis not present

## 2023-01-20 DIAGNOSIS — M542 Cervicalgia: Secondary | ICD-10-CM | POA: Diagnosis not present

## 2023-01-20 DIAGNOSIS — G44209 Tension-type headache, unspecified, not intractable: Secondary | ICD-10-CM | POA: Diagnosis not present

## 2023-01-20 DIAGNOSIS — L299 Pruritus, unspecified: Secondary | ICD-10-CM

## 2023-01-20 DIAGNOSIS — F5101 Primary insomnia: Secondary | ICD-10-CM

## 2023-01-20 MED ORDER — CYCLOBENZAPRINE HCL 10 MG PO TABS: 10.0000 mg | ORAL_TABLET | Freq: Three times a day (TID) | ORAL | 0 refills | Status: DC | PRN

## 2023-01-20 NOTE — Progress Notes (Signed)
Acute Office Visit  Subjective:     Patient ID: Leon Woodhams., male    DOB: 02-26-1965, 57 y.o.   MRN: [id number]  Chief Complaint  Patient presents with   Headache    For 3 weeks, Neck pain, Insomnia, Itching alot    HPI Patient is in today for headache and neck pain for 2 weeks.   Discussed the use of AI scribe software for clinical note transcription with the patient, who gave verbal consent to proceed.  History of Present Illness   The patient, with a history of hypertension, presents with neck pain and headaches that have no specific pattern. The headaches start at the back of the head and work his way around. The patient also experiences nausea, but it is unclear if this is related to the headaches. The neck pain is located at the back and sometimes extends to the shoulders. The patient has been taking Tylenol for the pain for about two weeks. He states that he sleeps on a couch in his mom's apartment and thinks it may be related to this.   The patient also reports itching, primarily on the arms and legs, which has been ongoing for some time. The itching tends to occur during downtime, particularly in the evenings and on weekends. The patient has tried various interventions, including changing laundry detergent and stopping testosterone medication, but the itching persists.  The patient has been experiencing shortness of breath during strenuous activities, which he attributes to his age and weight. He also mentions having trouble sleeping due to caring for his mother and sleeping on a couch. The patient's blood pressure has been on the high end for the past two weeks, which coincides with the period he has been experiencing headaches.      ROS See pertinent positives and negatives per HPI.    Objective:    BP (!) 152/80 (BP Location: Left Arm, Cuff Size: Large)   Pulse 62   Temp (!) 97.3 F (36.3 C)   Ht 6\' 3"  (1.905 m)   Wt 258 lb 9.6 oz (117.3 kg)   SpO2 97%   BMI  32.32 kg/m     Physical Exam Vitals and nursing note reviewed.  Constitutional:      Appearance: Normal appearance.  HENT:     Head: Normocephalic.  Eyes:     Conjunctiva/sclera: Conjunctivae normal.  Cardiovascular:     Rate and Rhythm: Normal rate and regular rhythm.     Pulses: Normal pulses.     Heart sounds: Normal heart sounds.  Pulmonary:     Effort: Pulmonary effort is normal.     Breath sounds: Normal breath sounds.  Musculoskeletal:        General: Tenderness (slight tenderness in posterior cervical spine) present. Normal range of motion.     Cervical back: Normal range of motion.  Skin:    General: Skin is warm.  Neurological:     General: No focal deficit present.     Mental Status: He is alert and oriented to person, place, and time.  Psychiatric:        Mood and Affect: Mood normal.        Behavior: Behavior normal.        Thought Content: Thought content normal.        Judgment: Judgment normal.       Assessment & Plan:   Problem List Items Addressed This Visit       Cardiovascular and Mediastinum  HTN (hypertension)   His blood pressure has been slightly elevated at 142/70 for the past two weeks, possibly due to stress. He monitors his blood pressure at home. He should continue home blood pressure monitoring and notify the provider if blood pressure consistently remains above 140/90.        Musculoskeletal and Integument   Pruritus   He reports itching primarily on his arms and anterior thighs, more noticeable during downtime, without any rash or skin color changes. Despite stopping testosterone supplementation, there has been no improvement. We will start daily application of lotion after showering and consider starting Claritin if there is no improvement with lotion.        Other   Primary insomnia   He has difficulty sleeping, possibly related to stress and caring for his mother with dementia, and sleeps on a couch due to limited space. He  should continue Flexeril at bedtime as needed, which may also aid in sleep, and consider changes to his sleeping arrangement for improved comfort, such as a cot with memory foam.       Neck pain - Primary   He experiences frequent headaches and neck pain, likely due to stress and sleeping on a couch, without sensitivity to light or nausea. The neck pain, localized to the back of the neck, is associated with muscle tension. We will start Flexeril, 10mg  tablet at bedtime as needed for muscle relaxation and pain relief. Neck stretches given to do daily.      Tension headache   He experiences frequent headaches and neck pain, likely due to stress and sleeping on a couch, without sensitivity to light or nausea. The neck pain, localized to the back of the neck, is associated with muscle tension. We will start Flexeril, 10mg  tablet at bedtime as needed for muscle relaxation and pain relief.      Relevant Medications   cyclobenzaprine (FLEXERIL) 10 MG tablet   Meds ordered this encounter  Medications   cyclobenzaprine (FLEXERIL) 10 MG tablet    Sig: Take 1 tablet (10 mg total) by mouth 3 (three) times daily as needed for muscle spasms.    Dispense:  30 tablet    Refill:  0    Return if symptoms worsen or fail to improve.  Gerre Scull, NP

## 2023-01-20 NOTE — Assessment & Plan Note (Signed)
He experiences frequent headaches and neck pain, likely due to stress and sleeping on a couch, without sensitivity to light or nausea. The neck pain, localized to the back of the neck, is associated with muscle tension. We will start Flexeril, 10mg  tablet at bedtime as needed for muscle relaxation and pain relief.

## 2023-01-20 NOTE — Patient Instructions (Addendum)
It was great to see you!  Start using lotion on your skin daily after your shower   You can also start claritin (loratadine) 10mg  daily to help with itching  Start flexeril 1 tablet at bedtime as needed.   Keep checking your blood pressure daily.   Your mychart user name is: [email or web address]   Let's follow-up if your symptoms worsen or any concerns   Take care,  Rodman Pickle, NP

## 2023-01-20 NOTE — Assessment & Plan Note (Signed)
He experiences frequent headaches and neck pain, likely due to stress and sleeping on a couch, without sensitivity to light or nausea. The neck pain, localized to the back of the neck, is associated with muscle tension. We will start Flexeril, 10mg  tablet at bedtime as needed for muscle relaxation and pain relief. Neck stretches given to do daily.

## 2023-01-20 NOTE — Assessment & Plan Note (Signed)
He has difficulty sleeping, possibly related to stress and caring for his mother with dementia, and sleeps on a couch due to limited space. He should continue Flexeril at bedtime as needed, which may also aid in sleep, and consider changes to his sleeping arrangement for improved comfort, such as a cot with memory foam.

## 2023-01-20 NOTE — Assessment & Plan Note (Signed)
His blood pressure has been slightly elevated at 142/70 for the past two weeks, possibly due to stress. He monitors his blood pressure at home. He should continue home blood pressure monitoring and notify the provider if blood pressure consistently remains above 140/90.

## 2023-01-20 NOTE — Assessment & Plan Note (Signed)
He reports itching primarily on his arms and anterior thighs, more noticeable during downtime, without any rash or skin color changes. Despite stopping testosterone supplementation, there has been no improvement. We will start daily application of lotion after showering and consider starting Claritin if there is no improvement with lotion.

## 2023-02-18 ENCOUNTER — Encounter: Payer: Self-pay | Admitting: Nurse Practitioner

## 2023-02-20 DIAGNOSIS — Z419 Encounter for procedure for purposes other than remedying health state, unspecified: Secondary | ICD-10-CM | POA: Diagnosis not present

## 2023-02-24 ENCOUNTER — Encounter (HOSPITAL_BASED_OUTPATIENT_CLINIC_OR_DEPARTMENT_OTHER): Payer: Self-pay

## 2023-02-24 ENCOUNTER — Emergency Department (HOSPITAL_BASED_OUTPATIENT_CLINIC_OR_DEPARTMENT_OTHER)
Admission: EM | Admit: 2023-02-24 | Discharge: 2023-02-24 | Disposition: A | Discharge status: Home / Self Care | Payer: Medicaid Other

## 2023-02-24 ENCOUNTER — Other Ambulatory Visit: Payer: Self-pay

## 2023-02-24 DIAGNOSIS — Z1152 Encounter for screening for COVID-19: Secondary | ICD-10-CM | POA: Insufficient documentation

## 2023-02-24 DIAGNOSIS — R21 Rash and other nonspecific skin eruption: Secondary | ICD-10-CM | POA: Insufficient documentation

## 2023-02-24 DIAGNOSIS — Z91040 Latex allergy status: Secondary | ICD-10-CM | POA: Insufficient documentation

## 2023-02-24 LAB — RESP PANEL BY RT-PCR (RSV, FLU A&B, COVID)  RVPGX2
Influenza A by PCR: NEGATIVE
Influenza B by PCR: NEGATIVE
Resp Syncytial Virus by PCR: NEGATIVE
SARS Coronavirus 2 by RT PCR: NEGATIVE

## 2023-02-24 LAB — GROUP A STREP BY PCR: Group A Strep by PCR: NOT DETECTED

## 2023-02-24 MED ORDER — ACYCLOVIR 800 MG PO TABS: 800.0000 mg | ORAL_TABLET | Freq: Every day | ORAL | 0 refills | Status: DC

## 2023-02-24 NOTE — ED Provider Notes (Signed)
[place] [facility] Provider Note   CSN: [id number] Arrival date & time: 02/24/23  1223     History  Chief Complaint  Patient presents with   Rash    Leon Pena. is a 58 y.o. male.  58 year old male with no reported past medical history presented to the emergency department today with concern for rash on his right arm.  The patient states that his mother is currently being treated for shingles.  He states that he has some mild discomfort in the right arm with this.  He also reports having a cough and sore throat.  He came to the emergency department today due to this.  He denies any fevers.  He denies any difficulty breathing or swallowing.   Rash Associated symptoms: sore throat        Home Medications Prior to Admission medications   Medication Sig Start Date End Date Taking? Authorizing Provider  acyclovir (ZOVIRAX) 800 MG tablet Take 1 tablet (800 mg total) by mouth 5 (five) times daily. 02/24/23  Yes Durwin Glaze, MD  Capsicum, Cayenne, (CAYENNE PO) Take by mouth daily. Patient not taking: Reported on 01/20/2023    [provider]  cyclobenzaprine (FLEXERIL) 10 MG tablet Take 1 tablet (10 mg total) by mouth 3 (three) times daily as needed for muscle spasms. 01/20/23   McElwee, Jake Church, NP  Ginger, Zingiber officinalis, (GINGER EXTRACT PO) daily. Patient not taking: Reported on 01/20/2023    [provider]  Multiple Vitamins-Minerals (EMERGEN-C IMMUNE PLUS PO) daily. Patient not taking: Reported on 01/20/2023    [provider]  tadalafil (CIALIS) 5 MG tablet Take 1 tablet (5 mg total) by mouth daily. Patient not taking: Reported on 01/20/2023 11/12/22   Gerre Scull, NP      Allergies    Latex, Compazine, and Promethazine hcl    Review of Systems   Review of Systems  HENT:  Positive for sore throat.   Skin:  Positive for rash.  All other systems reviewed and are negative.   Physical  Exam Updated Vital Signs BP (!) 153/93 (BP Location: Left Arm)   Pulse 64   Temp 97.7 F (36.5 C) (Oral)   Resp 16   Ht 6\' 3"  (1.905 m)   Wt 115.2 kg   SpO2 97%   BMI 31.75 kg/m  Physical Exam Vitals and nursing note reviewed.   Gen: NAD Eyes: PERRL, EOMI HEENT: no oropharyngeal swelling Neck: trachea midline, no meningismus Resp: clear to auscultation bilaterally Card: RRR, no murmurs, rubs, or gallops Abd: nontender, nondistended Extremities: no calf tenderness, no edema Vascular: 2+ radial pulses bilaterally, 2+ DP pulses bilaterally Skin: There are a cluster of small nodular lesions noted over the patient's arm with some signs of excoriation of few these lesions with small vesicles Psyc: acting appropriately   ED Results / Procedures / Treatments   Labs (all labs ordered are listed, but only abnormal results are displayed) Labs Reviewed  RESP PANEL BY RT-PCR (RSV, FLU A&B, COVID)  [id number]  GROUP A STREP BY PCR    EKG None  Radiology No results found.  Procedures Procedures    Medications Ordered in ED Medications - No data to display  ED Course/ Medical Decision Making/ A&P                                 Medical Decision Making 58 year old  male presenting to the emergency department today with rash over the right arm along with some URI symptoms.  I will obtain a COVID and flu swab on the patient as well as a strep screen.  The patient does not have any significant exudates or swelling of the posterior oropharynx.  Inspect this rash may be viral but given the unilateral nature and recent exposure I will treat the patient with acyclovir if his workup is unremarkable.  The patient's work appears reassuring.  He is treated with acyclovir and is discharged with return precautions.  Risk Prescription drug management.          Final Clinical Impression(s) / ED Diagnoses Final diagnoses:  Rash and nonspecific skin eruption    Rx / DC Orders ED  Discharge Orders          Ordered    acyclovir (ZOVIRAX) 800 MG tablet  5 times daily        02/24/23 1508              Durwin Glaze, MD 02/24/23 1515

## 2023-02-24 NOTE — Discharge Instructions (Signed)
Your rash may be a viral rash but it is only on 1 part of your arm so I am treating you for potential shingles.  Please take the antiviral as prescribed.  Please follow-up with your doctor and return to the ER for worsening symptoms.  Your viral testing and strep test today were negative.

## 2023-02-24 NOTE — ED Triage Notes (Signed)
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 States that his mom is  3 weeks into shingles and this morning he noticed a rash on right wrist. No pain.

## 2023-02-25 ENCOUNTER — Ambulatory Visit: Payer: BC Managed Care – PPO | Admitting: Nurse Practitioner

## 2023-02-25 ENCOUNTER — Ambulatory Visit: Payer: Self-pay | Admitting: Nurse Practitioner

## 2023-02-25 ENCOUNTER — Other Ambulatory Visit: Payer: Self-pay

## 2023-02-25 ENCOUNTER — Telehealth: Payer: Self-pay

## 2023-02-25 ENCOUNTER — Emergency Department (HOSPITAL_BASED_OUTPATIENT_CLINIC_OR_DEPARTMENT_OTHER): Payer: Medicaid Other

## 2023-02-25 ENCOUNTER — Encounter (HOSPITAL_BASED_OUTPATIENT_CLINIC_OR_DEPARTMENT_OTHER): Payer: Self-pay | Admitting: Emergency Medicine

## 2023-02-25 DIAGNOSIS — Z20822 Contact with and (suspected) exposure to covid-19: Secondary | ICD-10-CM | POA: Diagnosis not present

## 2023-02-25 DIAGNOSIS — R918 Other nonspecific abnormal finding of lung field: Secondary | ICD-10-CM | POA: Diagnosis not present

## 2023-02-25 DIAGNOSIS — I2510 Atherosclerotic heart disease of native coronary artery without angina pectoris: Secondary | ICD-10-CM | POA: Diagnosis not present

## 2023-02-25 DIAGNOSIS — Z905 Acquired absence of kidney: Secondary | ICD-10-CM | POA: Diagnosis not present

## 2023-02-25 DIAGNOSIS — R1032 Left lower quadrant pain: Secondary | ICD-10-CM | POA: Diagnosis present

## 2023-02-25 DIAGNOSIS — N200 Calculus of kidney: Secondary | ICD-10-CM | POA: Diagnosis not present

## 2023-02-25 DIAGNOSIS — R059 Cough, unspecified: Secondary | ICD-10-CM | POA: Diagnosis not present

## 2023-02-25 DIAGNOSIS — J09X2 Influenza due to identified novel influenza A virus with other respiratory manifestations: Secondary | ICD-10-CM | POA: Diagnosis not present

## 2023-02-25 DIAGNOSIS — N2889 Other specified disorders of kidney and ureter: Secondary | ICD-10-CM | POA: Diagnosis not present

## 2023-02-25 DIAGNOSIS — R109 Unspecified abdominal pain: Secondary | ICD-10-CM | POA: Diagnosis not present

## 2023-02-25 LAB — BASIC METABOLIC PANEL
Anion gap: 10 (ref 5–15)
BUN: 10 mg/dL (ref 6–20)
CO2: 23 mmol/L (ref 22–32)
Calcium: 9.2 mg/dL (ref 8.9–10.3)
Chloride: 100 mmol/L (ref 98–111)
Creatinine, Ser: 1.32 mg/dL — ABNORMAL HIGH (ref 0.61–1.24)
GFR, Estimated: >60 mL/min (ref >60)
Glucose, Bld: 114 mg/dL — ABNORMAL HIGH (ref 70–99)
Potassium: 3.6 mmol/L (ref 3.5–5.1)
Sodium: 133 mmol/L — ABNORMAL LOW (ref 135–145)

## 2023-02-25 LAB — URINALYSIS, ROUTINE W REFLEX MICROSCOPIC
Bilirubin Urine: NEGATIVE
Glucose, UA: NEGATIVE mg/dL
Ketones, ur: NEGATIVE mg/dL
Leukocytes,Ua: NEGATIVE
Nitrite: NEGATIVE
Protein, ur: 30 mg/dL — AB
Specific Gravity, Urine: 1.02 (ref 1.005–1.030)
pH: 6 (ref 5.0–8.0)

## 2023-02-25 LAB — URINALYSIS, MICROSCOPIC (REFLEX)

## 2023-02-25 LAB — CBC
HCT: 44.2 % (ref 39.0–52.0)
Hemoglobin: 15 g/dL (ref 13.0–17.0)
MCH: 29.9 pg (ref 26.0–34.0)
MCHC: 33.9 g/dL (ref 30.0–36.0)
MCV: 88 fL (ref 80.0–100.0)
Platelets: 210 10*3/uL (ref 150–400)
RBC: 5.02 MIL/uL (ref 4.22–5.81)
RDW: 12.4 % (ref 11.5–15.5)
WBC: 6.9 10*3/uL (ref 4.0–10.5)
nRBC: 0 % (ref 0.0–0.2)

## 2023-02-25 LAB — GROUP A STREP BY PCR: Group A Strep by PCR: NOT DETECTED

## 2023-02-25 LAB — RESP PANEL BY RT-PCR (RSV, FLU A&B, COVID)  RVPGX2
Influenza A by PCR: POSITIVE — AB
Influenza B by PCR: NEGATIVE
Resp Syncytial Virus by PCR: NEGATIVE
SARS Coronavirus 2 by RT PCR: NEGATIVE

## 2023-02-25 NOTE — Telephone Encounter (Signed)
Sent Patient a direct link to get on the my chart video visit around 3:10. Patient still is not on the video visit so I called him at 3:22 no answer. Left message to call the office back regarding his visit.

## 2023-02-25 NOTE — Transitions of Care (Post Inpatient/ED Visit) (Unsigned)
   02/25/2023  Name: Leon Pena. MRN: [id number] DOB: 30-Jan-1966  Today's TOC FU Call Status: Today's TOC FU Call Status:: Unsuccessful Call (1st Attempt) Unsuccessful Call (1st Attempt) Date: 02/25/23  Attempted to reach the patient regarding the most recent Inpatient/ED visit.  Follow Up Plan: Additional outreach attempts will be made to reach the patient to complete the Transitions of Care (Post Inpatient/ED visit) call.   Signature Arvil Persons, BSN, Charity fundraiser

## 2023-02-25 NOTE — Telephone Encounter (Signed)
  Chief Complaint: Shingles, worsening symptoms Symptoms: Fever, cough, fatigue Frequency: Ongoing since yesterday evening Pertinent Negatives: Patient denies N/V Disposition: [] ED /[] Urgent Care (no appt availability in office) / [x] Appointment(In office/virtual)/ []  [facility]/ [] Home Care/ [] Refused Recommended Disposition /[] [facility]/ []  Follow-up with PCP Additional Notes: Pt reports he was diagnosed with shingles yesterday after caring for his mother who has had them for a few weeks. 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 He states his symptoms have been worsening noting cough, fever of 101.8, fatigue, HA. Virtual visit scheduled for today. This RN educated pt on home care, new-worsening symptoms, when to call back/seek emergent care. Pt verbalized understanding and agrees to plan.   Copied from CRM [id number]. Topic: Clinical - Red Word Triage >> Feb 25, 2023  2:11 PM Gurney Maxin H wrote: [facility] that prompted transfer to Nurse Triage: Patient states he was diagnosed with Shingles yesterday and feels like he's getting worse, no energy, oxygen level down to 90% and temperature 101.8. Reason for Disposition  Fever > 100.4 F (38.0 C)  Answer Assessment - Initial Assessment Questions 1. APPEARANCE of RASH: "Describe the rash."      Dry rash 2. LOCATION: "Where is the rash located?"      Right wrist 3. ONSET: "When did the rash start?"      Day before yesterday 4. ITCHING: "Does the rash itch?" If Yes, ask: "How bad is the itch?"  (Scale 1-10; or mild, moderate, severe)     No 5. PAIN: "Does the rash hurt?" If Yes, ask: "How bad is the pain?"  (Scale 0-10; or none, mild, moderate, severe)    - NONE (0): no pain    - MILD (1-3): doesn't interfere with normal activities     - MODERATE (4-7): interferes with normal activities or awakens from sleep     - SEVERE (8-10): excruciating pain, unable to do any normal activities     No 6.  OTHER SYMPTOMS: "Do you have any other symptoms?" (e.g., fever)     Fever, SpO2 84-90%, fatigue, HA, cough, SOB "somewhat but not too bad"  Protocols used: Shingles (Zoster)-A-AH

## 2023-02-25 NOTE — ED Triage Notes (Signed)
Pt reports he was dx with shingles yesterday and started taking prescribed meds, last night started having n/v, left flank pain, HA, body aches, fever (102.8 highest at home), cough, and sore throat

## 2023-02-26 ENCOUNTER — Emergency Department (HOSPITAL_BASED_OUTPATIENT_CLINIC_OR_DEPARTMENT_OTHER): Payer: Medicaid Other

## 2023-02-26 ENCOUNTER — Encounter (HOSPITAL_BASED_OUTPATIENT_CLINIC_OR_DEPARTMENT_OTHER): Payer: Self-pay

## 2023-02-26 ENCOUNTER — Other Ambulatory Visit: Payer: Self-pay

## 2023-02-26 ENCOUNTER — Emergency Department (HOSPITAL_BASED_OUTPATIENT_CLINIC_OR_DEPARTMENT_OTHER)
Admission: EM | Admit: 2023-02-26 | Discharge: 2023-02-26 | Disposition: A | Discharge status: Home / Self Care | Payer: Medicaid Other | Attending: Emergency Medicine | Admitting: Emergency Medicine

## 2023-02-26 DIAGNOSIS — I2510 Atherosclerotic heart disease of native coronary artery without angina pectoris: Secondary | ICD-10-CM | POA: Diagnosis not present

## 2023-02-26 DIAGNOSIS — R109 Unspecified abdominal pain: Secondary | ICD-10-CM | POA: Diagnosis not present

## 2023-02-26 DIAGNOSIS — Z905 Acquired absence of kidney: Secondary | ICD-10-CM | POA: Diagnosis not present

## 2023-02-26 DIAGNOSIS — N2889 Other specified disorders of kidney and ureter: Secondary | ICD-10-CM | POA: Diagnosis not present

## 2023-02-26 DIAGNOSIS — J111 Influenza due to unidentified influenza virus with other respiratory manifestations: Secondary | ICD-10-CM

## 2023-02-26 DIAGNOSIS — N200 Calculus of kidney: Secondary | ICD-10-CM | POA: Diagnosis not present

## 2023-02-26 LAB — TROPONIN I (HIGH SENSITIVITY)
Troponin I (High Sensitivity): 5 ng/L (ref <18)
Troponin I (High Sensitivity): 5 ng/L (ref <18)

## 2023-02-26 MED ORDER — OSELTAMIVIR PHOSPHATE 75 MG PO CAPS: 75.0000 mg | ORAL_CAPSULE | Freq: Two times a day (BID) | ORAL | 0 refills | Status: DC

## 2023-02-26 MED ORDER — SODIUM CHLORIDE 0.9 % IV BOLUS
1000.0000 mL | Freq: Once | INTRAVENOUS | Status: AC
  Administered 2023-02-26: 1000 mL via INTRAVENOUS

## 2023-02-26 MED ORDER — DOXYCYCLINE HYCLATE 100 MG PO CAPS: 100.0000 mg | ORAL_CAPSULE | Freq: Two times a day (BID) | ORAL | 0 refills | Status: DC

## 2023-02-26 MED ORDER — IOHEXOL 350 MG/ML SOLN
75.0000 mL | Freq: Once | INTRAVENOUS | Status: AC | PRN
  Administered 2023-02-26: 75 mL via INTRAVENOUS

## 2023-02-26 MED ORDER — DOXYCYCLINE HYCLATE 100 MG PO TABS
100.0000 mg | ORAL_TABLET | Freq: Once | ORAL | Status: AC
  Administered 2023-02-26: 100 mg via ORAL
  Filled 2023-02-26: qty 1

## 2023-02-26 MED ORDER — ACETAMINOPHEN 325 MG PO TABS
650.0000 mg | ORAL_TABLET | Freq: Once | ORAL | Status: AC
  Administered 2023-02-26: 650 mg via ORAL
  Filled 2023-02-26: qty 2

## 2023-02-26 NOTE — Discharge Instructions (Addendum)
Keep yourself hydrated.  Use Tylenol or Motrin as needed for aches and for fever.  You are treated for both the flu and possible pneumonia.  You do have several kidney stones in the left side today but no evidence of obstruction or infection in the urine.  Follow-up with the urologist for further evaluation of your kidney stones.  Return to the ED with worsening pain, fever, vomiting, not able to urinate or other concerns.  Follow-up with your doctor.  Return to the ED if worsening symptoms including exertional chest pain, shortness of breath, unable to eat or drink, confusion or other concerns.

## 2023-02-26 NOTE — Treatment Plan (Signed)
58 year old male currently at [facility] ED with history of solitary left kidney, chronic dilation of the left kidney who presents with concern for nausea vomiting, headaches body aches fever to 102.8 at home.   Patient was febrile to 101.1 in the ED.  Other vital stable.  He has no evidence of leukocytosis.  Creatinine mildly elevated at 1.32 from a baseline of 1.  UA negative for infection.  He tested positive for flu.  CT scan was performed which showed evidence of a solitary left kidney.  The pelvis was dilated and came to a point at the UPJ with a nondilated ureter.  No evidence of stone in the ureter.  Stone is located in the calyces.  No evidence of stone obstructing the UPJ or ureter.  This dilation in the pelvis is evident on a previous CT from 2023 as well.  Although UPJ obstruction can cause concern for infection, at this time do not feel like he needs to be decompressed.  Given positive flu with a negative urine, the likely source of his body aches and symptoms and fever are due to the flu.  If patient changes symptomatically or a UA becomes positive, patient may need decompression but he does not need that at this time.  Of note, patient had his kidney removed in 1998.  Looks like he was last followed by Dr. Bryson Ha and last seen in 2006.  Recommend close follow-up with urology  He has a history of nephrolithiasis with a known left dilated

## 2023-02-26 NOTE — ED Provider Notes (Signed)
[facility] Provider Note   CSN: [id number] Arrival date & time: 02/25/23  2150     History  Chief Complaint  Patient presents with   Cough   Flank Pain    Leon Pena. is a 58 y.o. male.  Patient with a history of pretension, depression, kidney stone, seen yesterday with concern for possible shingles to his right arm here with 24 hours of bodyaches, chills, headache, left flank pain, fever, cough, sore throat, fatigue and "hitting a wall".  States he felt this way since about 1 AM yesterday.  Has had fever at home as high as 102, body aches, gradual onset diffuse headache, left-sided chest pain has been constant for the past 24 hours, left flank pain, aches, chills, coughing.  Nothing coming up when he coughs.  No abdominal pain.  No vomiting or diarrhea.  No pain with urination or blood in the urine.  He was started on acyclovir for questionable shingles yesterday.  The history is provided by the patient.  Cough Associated symptoms: chills, fever, headaches, myalgias and sore throat   Associated symptoms: no chest pain and no rash   Flank Pain Associated symptoms include headaches. Pertinent negatives include no chest pain and no abdominal pain.       Home Medications Prior to Admission medications   Medication Sig Start Date End Date Taking? Authorizing Provider  acyclovir (ZOVIRAX) 800 MG tablet Take 1 tablet (800 mg total) by mouth 5 (five) times daily. 02/24/23   Durwin Glaze, MD  Capsicum, Cayenne, (CAYENNE PO) Take by mouth daily. Patient not taking: Reported on 01/20/2023    [provider]  cyclobenzaprine (FLEXERIL) 10 MG tablet Take 1 tablet (10 mg total) by mouth 3 (three) times daily as needed for muscle spasms. 01/20/23   McElwee, Jake Church, NP  Ginger, Zingiber officinalis, (GINGER EXTRACT PO) daily. Patient not taking: Reported on 01/20/2023    [provider]  Multiple Vitamins-Minerals  (EMERGEN-C IMMUNE PLUS PO) daily. Patient not taking: Reported on 01/20/2023    [provider]  tadalafil (CIALIS) 5 MG tablet Take 1 tablet (5 mg total) by mouth daily. Patient not taking: Reported on 01/20/2023 11/12/22   Gerre Scull, NP      Allergies    Latex, Compazine, and Promethazine hcl    Review of Systems   Review of Systems  Constitutional:  Positive for activity change, appetite change, chills, fatigue and fever.  HENT:  Positive for congestion and sore throat.   Respiratory:  Positive for cough. Negative for chest tightness.   Cardiovascular:  Negative for chest pain.  Gastrointestinal:  Negative for abdominal pain, nausea and vomiting.  Genitourinary:  Positive for flank pain and frequency. Negative for dysuria and hematuria.  Musculoskeletal:  Positive for arthralgias and myalgias.  Skin:  Negative for rash.  Neurological:  Positive for headaches. Negative for weakness.    all other systems are negative except as noted in the HPI and PMH.   Physical Exam Updated Vital Signs BP (!) 93/53 (BP Location: Right Arm)   Pulse 74   Temp 98.6 F (37 C) (Oral)   Resp 12   Ht 6\' 3"  (1.905 m)   Wt 115.7 kg   SpO2 100%   BMI 31.87 kg/m  Physical Exam Vitals and nursing note reviewed.  Constitutional:      General: He is not in acute distress.    Appearance: He is well-developed.  HENT:  Head: Normocephalic and atraumatic.     Mouth/Throat:     Pharynx: No oropharyngeal exudate.  Eyes:     Conjunctiva/sclera: Conjunctivae normal.     Pupils: Pupils are equal, round, and reactive to light.  Neck:     Comments: No meningismus. Cardiovascular:     Rate and Rhythm: Normal rate and regular rhythm.     Heart sounds: Normal heart sounds. No murmur heard. Pulmonary:     Effort: Pulmonary effort is normal. No respiratory distress.     Breath sounds: Normal breath sounds.  Chest:     Chest wall: Tenderness present.  Abdominal:     Palpations:  Abdomen is soft.     Tenderness: There is no abdominal tenderness. There is no guarding or rebound.  Musculoskeletal:        General: No tenderness. Normal range of motion.     Cervical back: Normal range of motion and neck supple.  Skin:    General: Skin is warm.     Findings: Erythema and rash present.     Comments: Erythematous papules right volar forearm  Neurological:     Mental Status: He is alert and oriented to person, place, and time.     Cranial Nerves: No cranial nerve deficit.     Motor: No abnormal muscle tone.     Coordination: Coordination normal.     Comments:  5/5 strength throughout. CN 2-12 intact.Equal grip strength.   Psychiatric:        Behavior: Behavior normal.     ED Results / Procedures / Treatments   Labs (all labs ordered are listed, but only abnormal results are displayed) Labs Reviewed  RESP PANEL BY RT-PCR (RSV, FLU A&B, COVID)  RVPGX2 - Abnormal; Notable for the following components:      Result Value   Influenza A by PCR POSITIVE (*)    All other components within normal limits  URINALYSIS, ROUTINE W REFLEX MICROSCOPIC - Abnormal; Notable for the following components:   Hgb urine dipstick MODERATE (*)    Protein, ur 30 (*)    All other components within normal limits  BASIC METABOLIC PANEL - Abnormal; Notable for the following components:   Sodium 133 (*)    Glucose, Bld 114 (*)    Creatinine, Ser 1.32 (*)    All other components within normal limits  URINALYSIS, MICROSCOPIC (REFLEX) - Abnormal; Notable for the following components:   Bacteria, UA FEW (*)    All other components within normal limits  GROUP A STREP BY PCR  CBC  TROPONIN I (HIGH SENSITIVITY)  TROPONIN I (HIGH SENSITIVITY)    EKG None  Radiology CT Renal Stone Study Result Date: 02/26/2023 CLINICAL DATA:  Abdominal, flank pain EXAM: CT ABDOMEN AND PELVIS WITHOUT CONTRAST TECHNIQUE: Multidetector CT imaging of the abdomen and pelvis was performed following the standard  protocol without IV contrast. RADIATION DOSE REDUCTION: This exam was performed according to the departmental dose-optimization program which includes automated exposure control, adjustment of the mA and/or kV according to patient size and/or use of iterative reconstruction technique. COMPARISON:  07/27/2021 FINDINGS: Lower chest: No acute abnormality. Hepatobiliary: No focal liver abnormality is seen. Status post cholecystectomy. No biliary dilatation. Pancreas: Unremarkable Spleen: Unremarkable Adrenals/Urinary Tract: The adrenal glands unremarkable status post right nephrectomy. There is compensatory hypertrophy of the left kidney. There is fluid distension of the left renal pelvis, similar prior examination without associated caliceal dilation may reflect chronic distension or sequela of a mild UPJ obstruction. Multiple nonobstructing  calculi are seen mid and lower pole left kidney measuring up to 12 mm. Additionally, 2 nonobstructing dependently layering calculi measuring up to 9 mm have migrated into the distended left renal pelvis. No additional ureteral calculi. The bladder is unremarkable Stomach/Bowel: Stomach is within normal limits. Appendix appears normal. No evidence of bowel wall thickening, distention, or inflammatory changes. Vascular/Lymphatic: No significant vascular findings are present. No enlarged abdominal or pelvic lymph nodes. Reproductive: Prostate is unremarkable. Other: No abdominal wall hernia or abnormality. No abdominopelvic ascites. Musculoskeletal: Osseous structures are age-appropriate. No acute bone abnormality. No lytic or blastic bone lesion. IMPRESSION: 1. Status post right nephrectomy. 2. Multiple nonobstructing left renal calculi measuring up to 12 mm. Additionally, 2 nonobstructing dependently layering calculi measuring up to 9 mm have migrated into the distended left renal pelvis. No associated caliceal dilation. 3. Fluid distension of the left renal pelvis, similar prior  examination without associated caliceal dilation may reflect chronic distension or sequela of a mild UPJ obstruction. Electronically Signed   By: Helyn Numbers M.D.   On: 02/26/2023 04:12   CT Angio Chest PE W and/or Wo Contrast Result Date: 02/26/2023 CLINICAL DATA:  Low 2 intermediate probability pulmonary embolism, positive D-dimer EXAM: CT ANGIOGRAPHY CHEST WITH CONTRAST TECHNIQUE: Multidetector CT imaging of the chest was performed using the standard protocol during bolus administration of intravenous contrast. Multiplanar CT image reconstructions and MIPs were obtained to evaluate the vascular anatomy. RADIATION DOSE REDUCTION: This exam was performed according to the departmental dose-optimization program which includes automated exposure control, adjustment of the mA and/or kV according to patient size and/or use of iterative reconstruction technique. CONTRAST:  75mL OMNIPAQUE IOHEXOL 350 MG/ML SOLN COMPARISON:  None Available. FINDINGS: Cardiovascular: Moderate coronary artery calcification. Global cardiac size within normal limits. No pericardial effusion. Central pulmonary arteries are adequately opacified and there is no intraluminal filling defect identified through the segmental level to suggest acute pulmonary embolism. The central pulmonary arteries are of normal caliber. No aortic aneurysm. Mediastinum/Nodes: No enlarged mediastinal, hilar, or axillary lymph nodes. Thyroid gland, trachea, and esophagus demonstrate no significant findings. Lungs/Pleura: Lungs are clear. No pleural effusion or pneumothorax. Upper Abdomen: No acute abnormality. Musculoskeletal: No chest wall abnormality. No acute or significant osseous findings. Review of the MIP images confirms the above findings. IMPRESSION: 1. No pulmonary embolism. No acute intrathoracic pathology identified. 2. Moderate coronary artery calcification. Electronically Signed   By: Helyn Numbers M.D.   On: 02/26/2023 04:05   DG Chest 2  View Result Date: 02/25/2023 CLINICAL DATA:  Cough EXAM: CHEST - 2 VIEW COMPARISON:  07/26/2022 FINDINGS: Mild right infrahilar opacity suggesting a mild pneumonia. Normal cardiac size. No pleural effusion or pneumothorax IMPRESSION: Mild right infrahilar opacity suggesting a mild pneumonia. Electronically Signed   By: Jasmine Pang M.D.   On: 02/25/2023 23:01    Procedures Procedures    Medications Ordered in ED Medications  sodium chloride 0.9 % bolus 1,000 mL (has no administration in time range)    ED Course/ Medical Decision Making/ A&P                                 Medical Decision Making Amount and/or Complexity of Data Reviewed Labs: ordered. Decision-making details documented in ED Course. Radiology: ordered and independent interpretation performed. Decision-making details documented in ED Course. ECG/medicine tests: ordered and independent interpretation performed. Decision-making details documented in ED Course.  Risk OTC drugs. Prescription drug  management.   24 hours of body aches, chills, fever, headache, URI symptoms with nonproductive cough and congestion and sore throat.  Stable vitals.  No distress.  Abdomen soft without peritoneal signs.  Clear lungs.  X-ray is concerning for possible right infiltrate.  Results reviewed and interpreted by me.  Influenza swab is positive. EKG is sinus rhythm and unchanged.  Labs reassuring.  Negative troponin.  Urinalysis with hematuria, negative for infection.  Patient does have known history of kidney stones.  No evidence of pulmonary embolism.  Pneumonia seen on chest x-ray not appreciated on CT scan. Patient does have multiple stones of the left kidney and renal pelvis without any acute obstruction  Presentation discussed with urology Dr. Gordan Payment.  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  Multiple left-sided kidney stones seen including the left renal  pelvis.  Creatinine slightly up to 1.3 from 1.0.  Urinalysis however negative for infection.  Patient's fever likely secondary to his influenza. He is urinating normally.  Denies any significant flank pain.  No evidence of UTI.  Urology feels no intervention necessary for elevated creatinine and fever in the setting of kidney stones.  No infection seen to urine.  Recommends treating for influenza and outpatient urology follow-up.  Will treat for influenza.  Discussed p.o. hydration at home and antipyretics.  Follow-up with PCP as well as urology for further evaluation.  Return to the ED with worsening pain, fever, vomiting, unable to urinate or other concerns.       Final Clinical Impression(s) / ED Diagnoses Final diagnoses:  Influenza    Rx / DC Orders ED Discharge Orders     None         Adea Geisel, Jeannett Senior, MD 02/26/23 [phone]

## 2023-02-28 NOTE — Transitions of Care (Post Inpatient/ED Visit) (Signed)
   02/28/2023  Name: Leon Pena. MRN: [id number] DOB: 1965/11/23  Today's TOC FU Call Status: Today's TOC FU Call Status:: Successful TOC FU Call Completed Unsuccessful Call (1st Attempt) Date: 02/25/23 [facility] FU Call Complete Date: 02/28/23 Patient's Name and Date of Birth confirmed.  Transition Care Management Follow-up Telephone Call Date of Discharge: 02/28/23 Discharge Facility: [facility] Type of Discharge: Emergency Department How have you been since you were released from the hospital?: Better Any questions or concerns?: Yes Patient Questions/Concerns:: Pt reports feeling very tired and wants a provider to look at blookwork to ensure nothing alarming with lab results.  Items Reviewed: Did you receive and understand the discharge instructions provided?: Yes Any new allergies since your discharge?: No Dietary orders reviewed?: NA Do you have support at home?: Yes  Medications Reviewed Today: Medications Reviewed Today   Medications were not reviewed in this encounter     Home Care and Equipment/Supplies: Were Home Health Services Ordered?: NA Any new equipment or medical supplies ordered?: NA  Functional Questionnaire: Do you need assistance with bathing/showering or dressing?: No Do you need assistance with meal preparation?: No Do you need assistance with eating?: No Do you have difficulty maintaining continence: No Do you need assistance with getting out of bed/getting out of a chair/moving?: No Do you have difficulty managing or taking your medications?: No  Follow up appointments reviewed: PCP Follow-up appointment confirmed?: NA Specialist Hospital Follow-up appointment confirmed?: NA Do you need transportation to your follow-up appointment?: No Do you understand care options if your condition(s) worsen?: Yes-patient verbalized understanding    SIGNATURE Arvil Persons, BSN, RN

## 2023-03-12 DIAGNOSIS — Z419 Encounter for procedure for purposes other than remedying health state, unspecified: Secondary | ICD-10-CM | POA: Diagnosis not present

## 2023-04-08 ENCOUNTER — Ambulatory Visit: Payer: Medicaid Other | Admitting: Internal Medicine

## 2023-04-08 ENCOUNTER — Ambulatory Visit: Payer: Self-pay | Admitting: Nurse Practitioner

## 2023-04-08 ENCOUNTER — Telehealth: Payer: Medicaid Other | Admitting: Nurse Practitioner

## 2023-04-08 ENCOUNTER — Encounter: Payer: Self-pay | Admitting: Nurse Practitioner

## 2023-04-08 DIAGNOSIS — R399 Unspecified symptoms and signs involving the genitourinary system: Secondary | ICD-10-CM

## 2023-04-08 MED ORDER — SULFAMETHOXAZOLE-TRIMETHOPRIM 800-160 MG PO TABS: 1.0000 | ORAL_TABLET | Freq: Two times a day (BID) | ORAL | 0 refills | Status: DC

## 2023-04-08 NOTE — Telephone Encounter (Signed)
 Copied from CRM [id number]. Topic: Clinical - Red Word Triage >> Apr 08, 2023 11:37 AM Dyann Kief wrote: [facility] that prompted transfer to Nurse Triage: Pain while urination, pt had appt today for UTI but cannot make it, CAL was contacted stated pt should not wait to long to be seen.  1:00pm- First attempt Call made to f/u and reschedule PCP appt.

## 2023-04-08 NOTE — Telephone Encounter (Signed)
 Noted.Patient was seen virtually by Rodman Pickle, NP today at 2pm

## 2023-04-08 NOTE — Patient Instructions (Signed)
 It was great to see you!  Start bactrim 1 tablet twice a day for 7 days  Drink plenty of fluids  You can still take AZO as needed for pain.   Let's follow-up if your symptoms worsen or don't improve  Take care,  Rodman Pickle, NP
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
## 2023-04-08 NOTE — Telephone Encounter (Signed)
 Noted.
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
## 2023-04-08 NOTE — Telephone Encounter (Signed)
 Chief Complaint: UTI symptoms Symptoms: dysuria, flank pain, urgency and frequency Frequency: 3 days ago Pertinent Negatives: Patient denies N/V Disposition: [] ED /[] Urgent Care (no appt availability in office) / [x] Appointment(In office/virtual)/ []  [facility]/ [] Home Care/ [] Refused Recommended Disposition /[] [facility]/ []  Follow-up with PCP Additional Notes: Pt reports 3 days of dysuria, flank pain, urgency/frequency, with a "low grade" fever this AM. Pt reports hx of UTIs, states this feels similar. 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 RN advised pt to be seen within 24 hrs. Pt states he cannot leave his mother for more than 15-20 minutes. Pt states a different nurse told him he could have a virtual appt and then drop a urine sample off later. RN called the CAL to ask the CAL if RN can do that for the pt. CAL said it appears pt has a 2pm virtual visit today. RN advised the pt he has a 2pm virtual visit. Pt logged into his MyChart and had to fix some of the settings on his phone for sound and video. At that time, pt told this RN that [facility] was calling his phone and he disconnected the call.   Reason for Disposition  Side (flank) or lower back pain present  Answer Assessment - Initial Assessment Questions 1. SYMPTOM: "What's the main symptom you're concerned about?" (e.g., frequency, incontinence)     Pain 2. ONSET: "When did the pain start?"     3 days ago 3. PAIN: "Is there any pain?" If Yes, ask: "How bad is it?" (Scale: 1-10; mild, moderate, severe)     "It hurts, probably a 7-8/10" - constant 4. CAUSE: "What do you think is causing the symptoms?"     UTI 5. OTHER SYMPTOMS: "Do you have any other symptoms?" (e.g., blood in urine, fever, flank pain, pain with urination)     Flank pain on the L side, burning when he urinates, urgency/frequency, low grade fever.  Protocols used: Urinary Symptoms-A-AH

## 2023-04-09 DIAGNOSIS — Z419 Encounter for procedure for purposes other than remedying health state, unspecified: Secondary | ICD-10-CM | POA: Diagnosis not present

## 2023-04-18 ENCOUNTER — Ambulatory Visit

## 2023-04-19 ENCOUNTER — Ambulatory Visit: Payer: Medicaid Other | Admitting: Nurse Practitioner

## 2023-04-25 ENCOUNTER — Other Ambulatory Visit: Payer: Self-pay

## 2023-04-25 ENCOUNTER — Ambulatory Visit
Admission: RE | Admit: 2023-04-25 | Discharge: 2023-04-25 | Disposition: A | Discharge status: Home / Self Care | Source: Ambulatory Visit | Attending: Family Medicine | Admitting: Family Medicine

## 2023-04-25 VITALS — BP 129/80 | HR 76 | Temp 97.9°F | Resp 18 | Ht 75.0 in | Wt 260.0 lb

## 2023-04-25 DIAGNOSIS — N390 Urinary tract infection, site not specified: Secondary | ICD-10-CM | POA: Insufficient documentation

## 2023-04-25 LAB — POCT URINALYSIS DIP (MANUAL ENTRY)
Bilirubin, UA: NEGATIVE
Glucose, UA: NEGATIVE mg/dL
Ketones, POC UA: NEGATIVE mg/dL
Nitrite, UA: NEGATIVE
Protein Ur, POC: 100 mg/dL — AB
Spec Grav, UA: 1.02 (ref 1.010–1.025)
Urobilinogen, UA: 0.2 U/dL
pH, UA: 5.5 (ref 5.0–8.0)

## 2023-04-25 MED ORDER — SULFAMETHOXAZOLE-TRIMETHOPRIM 800-160 MG PO TABS: 1.0000 | ORAL_TABLET | Freq: Two times a day (BID) | ORAL | 0 refills | Status: AC
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
## 2023-04-25 NOTE — ED Triage Notes (Addendum)
 Pt presents with complaints of urinary frequency, urinary burning, low grade fevers, low energy levels, and left flank pain x approximately three weeks. Pt was seen previously for these symptoms and was prescribed Bactrim, however did not take due to losing medication. 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 Pt currently rates his overall pain a 7/10.

## 2023-04-25 NOTE — Discharge Instructions (Addendum)
 Test results will be released to your MyChart account We will contact you if anything is positive and requires treatment.  Follow-up with your primary care provider. If you develop greater than 100.4, flank pain, vomiting, inability to urinate, concerns go to the emergency department

## 2023-04-26 ENCOUNTER — Encounter: Payer: Self-pay | Admitting: Physician Assistant

## 2023-04-26 LAB — URINE CULTURE: Culture: NO GROWTH

## 2023-05-06 ENCOUNTER — Ambulatory Visit: Payer: Self-pay

## 2023-05-06 ENCOUNTER — Ambulatory Visit: Admitting: Family Medicine

## 2023-05-06 NOTE — Telephone Encounter (Signed)
  Chief Complaint: Knee pain Symptoms: right knee pain, right leg cramps, fatigue, urinary frequency Frequency: one week Pertinent Negatives: Patient denies fever, CP, SOB Disposition: [] ED /[] Urgent Care (no appt availability in office) / [x] Appointment(In office/virtual)/ []  [facility]/ [] Home Care/ [] Refused Recommended Disposition /[] [facility]/ []  Follow-up with PCP Additional Notes: patient called with one week of right knee pain. Patient states pain is a 8 out of 10 that is worse if he moves certain ways or walks. Patient also endorses leg cramps, fatigue and frequent urination. Patient states he has been seen for urinary frequency at UC recently. Per protocol, patient is recommended to be seen today. No availability at PCP office. Patient is willing to go to another location to be seen today. Appointment made for today at 12:40 PM at alternative location. Address and information were given to patient. Patient verbalized understanding and all questions answered.    Copied from CRM [id number]. Topic: Clinical - Red Word Triage >> May 06, 2023  9:01 AM Leon Pena wrote: [facility] that prompted transfer to Nurse Triage: Right knee in pain, when he moves it certain ways or walk it hurts. Leg cramps, fatigue, frequent urination. Reason for Disposition  [1] SEVERE pain (e.g., excruciating, unable to walk) AND [2] not improved after 2 hours of pain medicine  Answer Assessment - Initial Assessment Questions 1. LOCATION and RADIATION: "Where is the pain located?"      Right Knee Pain 2. QUALITY: "What does the pain feel like?"  (e.g., sharp, dull, aching, burning)     Dull throbbing pain 3. SEVERITY: "How bad is the pain?" "What does it keep you from doing?"   (Scale 1-10; or mild, moderate, severe)   -  MILD (1-3): doesn't interfere with normal activities    -  MODERATE (4-7): interferes with normal activities (e.g., work or school) or awakens from sleep, limping    -   SEVERE (8-10): excruciating pain, unable to do any normal activities, unable to walk     8 out of 10 4. ONSET: "When did the pain start?" "Does it come and go, or is it there all the time?"     Started a week ago and has been constant 5. RECURRENT: "Have you had this pain before?" If Yes, ask: "When, and what happened then?"     When he tore his left meniscus 6. SETTING: "Has there been any recent work, exercise or other activity that involved that part of the body?"      No 7. AGGRAVATING FACTORS: "What makes the knee pain worse?" (e.g., walking, climbing stairs, running)     Move certain ways or walks 8. ASSOCIATED SYMPTOMS: "Is there any swelling or redness of the knee?"     swelling 9. OTHER SYMPTOMS: "Do you have any other symptoms?" (e.g., chest pain, difficulty breathing, fever, calf pain)     Right leg cramps, fatigue  Protocols used: Knee Pain-A-AH

## 2023-05-06 NOTE — Telephone Encounter (Signed)
 Patient scheduled for an appointment at [facility]

## 2023-05-18 ENCOUNTER — Ambulatory Visit: Admitting: Family Medicine

## 2023-05-21 DIAGNOSIS — Z419 Encounter for procedure for purposes other than remedying health state, unspecified: Secondary | ICD-10-CM | POA: Diagnosis not present

## 2023-06-20 DIAGNOSIS — Z419 Encounter for procedure for purposes other than remedying health state, unspecified: Secondary | ICD-10-CM | POA: Diagnosis not present

## 2023-07-20 ENCOUNTER — Telehealth: Payer: Self-pay

## 2023-07-20 MED ORDER — TADALAFIL 5 MG PO TABS: 5.0000 mg | ORAL_TABLET | Freq: Every day | ORAL | 0 refills | Status: DC

## 2023-07-20 NOTE — Telephone Encounter (Signed)
 Requesting: Tadalafil  Last Visit: 01/20/2023 Next Visit: 08/05/2023 Last Refill: 11/12/2022  Please Advise

## 2023-07-21 DIAGNOSIS — Z419 Encounter for procedure for purposes other than remedying health state, unspecified: Secondary | ICD-10-CM | POA: Diagnosis not present

## 2023-08-05 ENCOUNTER — Encounter: Admitting: Nurse Practitioner

## 2023-08-10 ENCOUNTER — Encounter: Admitting: Nurse Practitioner

## 2023-08-16 ENCOUNTER — Other Ambulatory Visit (INDEPENDENT_AMBULATORY_CARE_PROVIDER_SITE_OTHER): Payer: Self-pay

## 2023-08-16 ENCOUNTER — Encounter: Payer: Self-pay | Admitting: Orthopaedic Surgery

## 2023-08-16 ENCOUNTER — Ambulatory Visit: Admitting: Orthopaedic Surgery

## 2023-08-16 DIAGNOSIS — G8929 Other chronic pain: Secondary | ICD-10-CM

## 2023-08-16 DIAGNOSIS — M25561 Pain in right knee: Secondary | ICD-10-CM

## 2023-08-16 MED ORDER — LIDOCAINE HCL 1 % IJ SOLN
2.0000 mL | INTRAMUSCULAR | Status: AC | PRN
  Administered 2023-08-16: 2 mL

## 2023-08-16 MED ORDER — BUPIVACAINE HCL 0.5 % IJ SOLN
2.0000 mL | INTRAMUSCULAR | Status: AC | PRN
  Administered 2023-08-16: 2 mL via INTRA_ARTICULAR

## 2023-08-16 MED ORDER — METHYLPREDNISOLONE ACETATE 40 MG/ML IJ SUSP
40.0000 mg | INTRAMUSCULAR | Status: AC | PRN
  Administered 2023-08-16: 40 mg via INTRA_ARTICULAR
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
## 2023-08-17 ENCOUNTER — Other Ambulatory Visit (HOSPITAL_COMMUNITY): Payer: Self-pay

## 2023-08-17 ENCOUNTER — Inpatient Hospital Stay (HOSPITAL_COMMUNITY)

## 2023-08-17 ENCOUNTER — Telehealth (HOSPITAL_COMMUNITY): Payer: Self-pay | Admitting: Pharmacy Technician

## 2023-08-17 ENCOUNTER — Other Ambulatory Visit: Payer: Self-pay

## 2023-08-17 ENCOUNTER — Inpatient Hospital Stay (HOSPITAL_BASED_OUTPATIENT_CLINIC_OR_DEPARTMENT_OTHER)
Admission: EM | Admit: 2023-08-17 | Discharge: 2023-08-18 | DRG: 322 | Disposition: A | Discharge status: Home / Self Care | Attending: Cardiovascular Disease | Admitting: Cardiovascular Disease

## 2023-08-17 ENCOUNTER — Emergency Department (HOSPITAL_BASED_OUTPATIENT_CLINIC_OR_DEPARTMENT_OTHER)

## 2023-08-17 ENCOUNTER — Encounter (HOSPITAL_COMMUNITY)
Admission: EM | Disposition: A | Discharge status: Home / Self Care | Payer: Self-pay | Source: Home / Self Care | Attending: Cardiovascular Disease

## 2023-08-17 ENCOUNTER — Encounter (HOSPITAL_BASED_OUTPATIENT_CLINIC_OR_DEPARTMENT_OTHER): Payer: Self-pay | Admitting: Emergency Medicine

## 2023-08-17 DIAGNOSIS — N200 Calculus of kidney: Secondary | ICD-10-CM

## 2023-08-17 DIAGNOSIS — Z818 Family history of other mental and behavioral disorders: Secondary | ICD-10-CM | POA: Diagnosis not present

## 2023-08-17 DIAGNOSIS — Z87442 Personal history of urinary calculi: Secondary | ICD-10-CM | POA: Diagnosis not present

## 2023-08-17 DIAGNOSIS — Z888 Allergy status to other drugs, medicaments and biological substances status: Secondary | ICD-10-CM | POA: Diagnosis not present

## 2023-08-17 DIAGNOSIS — I213 ST elevation (STEMI) myocardial infarction of unspecified site: Principal | ICD-10-CM

## 2023-08-17 DIAGNOSIS — Z79899 Other long term (current) drug therapy: Secondary | ICD-10-CM | POA: Diagnosis not present

## 2023-08-17 DIAGNOSIS — Z833 Family history of diabetes mellitus: Secondary | ICD-10-CM | POA: Diagnosis not present

## 2023-08-17 DIAGNOSIS — I2510 Atherosclerotic heart disease of native coronary artery without angina pectoris: Secondary | ICD-10-CM

## 2023-08-17 DIAGNOSIS — R519 Headache, unspecified: Secondary | ICD-10-CM | POA: Diagnosis present

## 2023-08-17 DIAGNOSIS — I10 Essential (primary) hypertension: Secondary | ICD-10-CM | POA: Diagnosis present

## 2023-08-17 DIAGNOSIS — E785 Hyperlipidemia, unspecified: Secondary | ICD-10-CM | POA: Insufficient documentation

## 2023-08-17 DIAGNOSIS — F32A Depression, unspecified: Secondary | ICD-10-CM | POA: Diagnosis present

## 2023-08-17 DIAGNOSIS — N400 Enlarged prostate without lower urinary tract symptoms: Secondary | ICD-10-CM | POA: Diagnosis present

## 2023-08-17 DIAGNOSIS — E781 Pure hyperglyceridemia: Secondary | ICD-10-CM | POA: Diagnosis present

## 2023-08-17 DIAGNOSIS — Z7689 Persons encountering health services in other specified circumstances: Secondary | ICD-10-CM | POA: Diagnosis not present

## 2023-08-17 DIAGNOSIS — Z8249 Family history of ischemic heart disease and other diseases of the circulatory system: Secondary | ICD-10-CM | POA: Diagnosis not present

## 2023-08-17 DIAGNOSIS — Z9049 Acquired absence of other specified parts of digestive tract: Secondary | ICD-10-CM | POA: Diagnosis not present

## 2023-08-17 DIAGNOSIS — I2119 ST elevation (STEMI) myocardial infarction involving other coronary artery of inferior wall: Principal | ICD-10-CM

## 2023-08-17 DIAGNOSIS — Z955 Presence of coronary angioplasty implant and graft: Secondary | ICD-10-CM

## 2023-08-17 DIAGNOSIS — Z91040 Latex allergy status: Secondary | ICD-10-CM | POA: Diagnosis not present

## 2023-08-17 DIAGNOSIS — Z905 Acquired absence of kidney: Secondary | ICD-10-CM | POA: Diagnosis not present

## 2023-08-17 DIAGNOSIS — R079 Chest pain, unspecified: Secondary | ICD-10-CM | POA: Diagnosis not present

## 2023-08-17 HISTORY — PX: LEFT HEART CATH AND CORONARY ANGIOGRAPHY: CATH118249

## 2023-08-17 HISTORY — PX: CORONARY/GRAFT ACUTE MI REVASCULARIZATION: CATH118305

## 2023-08-17 LAB — CBC
HCT: 45.1 % (ref 39.0–52.0)
Hemoglobin: 15.7 g/dL (ref 13.0–17.0)
MCH: 30.2 pg (ref 26.0–34.0)
MCHC: 34.8 g/dL (ref 30.0–36.0)
MCV: 86.7 fL (ref 80.0–100.0)
Platelets: 247 K/uL (ref 150–400)
RBC: 5.2 MIL/uL (ref 4.22–5.81)
RDW: 12.6 % (ref 11.5–15.5)
WBC: 7.2 K/uL (ref 4.0–10.5)
nRBC: 0 % (ref 0.0–0.2)

## 2023-08-17 LAB — HEMOGLOBIN AND HEMATOCRIT, BLOOD
HCT: 45 % (ref 39.0–52.0)
Hemoglobin: 15.6 g/dL (ref 13.0–17.0)

## 2023-08-17 LAB — BASIC METABOLIC PANEL WITH GFR
Anion gap: 14 (ref 5–15)
BUN: 11 mg/dL (ref 6–20)
CO2: 22 mmol/L (ref 22–32)
Calcium: 9.6 mg/dL (ref 8.9–10.3)
Chloride: 102 mmol/L (ref 98–111)
Creatinine, Ser: 0.93 mg/dL (ref 0.61–1.24)
GFR, Estimated: >60 mL/min (ref >60)
Glucose, Bld: 123 mg/dL — ABNORMAL HIGH (ref 70–99)
Potassium: 4.4 mmol/L (ref 3.5–5.1)
Sodium: 138 mmol/L (ref 135–145)

## 2023-08-17 LAB — URINALYSIS, ROUTINE W REFLEX MICROSCOPIC
Bilirubin Urine: NEGATIVE
Glucose, UA: NEGATIVE mg/dL
Ketones, ur: NEGATIVE mg/dL
Leukocytes,Ua: NEGATIVE
Nitrite: NEGATIVE
Protein, ur: 30 mg/dL — AB
RBC / HPF: >50 RBC/hpf (ref 0–5)
Specific Gravity, Urine: 1.029 (ref 1.005–1.030)
pH: 6 (ref 5.0–8.0)

## 2023-08-17 LAB — LACTIC ACID, PLASMA: Lactic Acid, Venous: 1.8 mmol/L (ref 0.5–1.9)

## 2023-08-17 LAB — LIPID PANEL
Cholesterol: 166 mg/dL (ref 0–200)
HDL: 55 mg/dL (ref >40)
LDL Cholesterol: 81 mg/dL (ref 0–99)
Total CHOL/HDL Ratio: 3 ratio
Triglycerides: 151 mg/dL — ABNORMAL HIGH (ref <150)
VLDL: 30 mg/dL (ref 0–40)

## 2023-08-17 LAB — ECHOCARDIOGRAM COMPLETE
Area-P 1/2: 3.1 cm2
Height: 75 in
S' Lateral: 2.6 cm
Weight: 3999.99 [oz_av]

## 2023-08-17 LAB — CK: Total CK: 259 U/L (ref 49–397)

## 2023-08-17 LAB — PROTIME-INR
INR: 1 (ref 0.8–1.2)
Prothrombin Time: 13.2 s (ref 11.4–15.2)

## 2023-08-17 LAB — TROPONIN T, HIGH SENSITIVITY: Troponin T High Sensitivity: <15 ng/L (ref <19)

## 2023-08-17 LAB — APTT: aPTT: 29 s (ref 24–36)

## 2023-08-17 LAB — POCT ACTIVATED CLOTTING TIME: Activated Clotting Time: 256 s

## 2023-08-17 MED ORDER — HEPARIN (PORCINE) IN NACL 1000-0.9 UT/500ML-% IV SOLN
INTRAVENOUS | Status: DC | PRN
  Administered 2023-08-17: 1000 mL

## 2023-08-17 MED ORDER — HEPARIN SODIUM (PORCINE) 5000 UNIT/ML IJ SOLN
4000.0000 [IU] | Freq: Once | INTRAMUSCULAR | Status: AC
  Administered 2023-08-17: 4000 [IU] via INTRAVENOUS
  Filled 2023-08-17: qty 1

## 2023-08-17 MED ORDER — OXYCODONE HCL 5 MG PO TABS
5.0000 mg | ORAL_TABLET | ORAL | Status: DC | PRN
  Administered 2023-08-17 – 2023-08-18 (×2): 10 mg via ORAL
  Filled 2023-08-17 (×2): qty 2

## 2023-08-17 MED ORDER — FENTANYL CITRATE (PF) 100 MCG/2ML IJ SOLN
INTRAMUSCULAR | Status: AC
Start: 2023-08-17 — End: 2023-08-17
  Filled 2023-08-17: qty 2

## 2023-08-17 MED ORDER — TICAGRELOR 90 MG PO TABS
ORAL_TABLET | ORAL | Status: AC
  Filled 2023-08-17: qty 2

## 2023-08-17 MED ORDER — SODIUM CHLORIDE 0.9 % IV SOLN: INTRAVENOUS | Status: AC

## 2023-08-17 MED ORDER — SODIUM CHLORIDE 0.9% FLUSH: 3.0000 mL | INTRAVENOUS | Status: DC | PRN

## 2023-08-17 MED ORDER — LIDOCAINE HCL (PF) 1 % IJ SOLN
INTRAMUSCULAR | Status: DC | PRN
  Administered 2023-08-17: 2 mL

## 2023-08-17 MED ORDER — MIDAZOLAM HCL 2 MG/2ML IJ SOLN
INTRAMUSCULAR | Status: AC
  Filled 2023-08-17: qty 2

## 2023-08-17 MED ORDER — ASPIRIN 81 MG PO CHEW
81.0000 mg | CHEWABLE_TABLET | Freq: Every day | ORAL | Status: DC
  Administered 2023-08-18: 81 mg via ORAL
  Filled 2023-08-17: qty 1

## 2023-08-17 MED ORDER — HEPARIN SODIUM (PORCINE) 1000 UNIT/ML IJ SOLN
INTRAMUSCULAR | Status: DC | PRN
  Administered 2023-08-17: 3000 [IU] via INTRAVENOUS
  Administered 2023-08-17: 12000 [IU] via INTRAVENOUS

## 2023-08-17 MED ORDER — IOHEXOL 350 MG/ML SOLN
INTRAVENOUS | Status: DC | PRN
  Administered 2023-08-17: 130 mL

## 2023-08-17 MED ORDER — HEPARIN SODIUM (PORCINE) 1000 UNIT/ML IJ SOLN
INTRAMUSCULAR | Status: AC
  Filled 2023-08-17: qty 10

## 2023-08-17 MED ORDER — ASPIRIN 81 MG PO CHEW
324.0000 mg | CHEWABLE_TABLET | Freq: Once | ORAL | Status: AC
  Administered 2023-08-17: 324 mg via ORAL
  Filled 2023-08-17: qty 4

## 2023-08-17 MED ORDER — TICAGRELOR 90 MG PO TABS
ORAL_TABLET | ORAL | Status: DC | PRN
  Administered 2023-08-17: 180 mg via ORAL

## 2023-08-17 MED ORDER — VERAPAMIL HCL 2.5 MG/ML IV SOLN
INTRAVENOUS | Status: DC | PRN
  Administered 2023-08-17: 10 mL via INTRA_ARTERIAL

## 2023-08-17 MED ORDER — FENTANYL CITRATE (PF) 100 MCG/2ML IJ SOLN
INTRAMUSCULAR | Status: DC | PRN
  Administered 2023-08-17: 50 ug via INTRAVENOUS
  Administered 2023-08-17: 25 ug via INTRAVENOUS

## 2023-08-17 MED ORDER — SODIUM CHLORIDE 0.9 % IV SOLN: 250.0000 mL | INTRAVENOUS | Status: DC | PRN

## 2023-08-17 MED ORDER — ATORVASTATIN CALCIUM 80 MG PO TABS
80.0000 mg | ORAL_TABLET | Freq: Every day | ORAL | Status: DC
  Administered 2023-08-17 – 2023-08-18 (×2): 80 mg via ORAL
  Filled 2023-08-17 (×2): qty 1

## 2023-08-17 MED ORDER — HYDRALAZINE HCL 20 MG/ML IJ SOLN: 10.0000 mg | INTRAMUSCULAR | Status: AC | PRN

## 2023-08-17 MED ORDER — ONDANSETRON HCL 4 MG/2ML IJ SOLN
4.0000 mg | Freq: Four times a day (QID) | INTRAMUSCULAR | Status: DC | PRN
  Administered 2023-08-18 (×2): 4 mg via INTRAVENOUS
  Filled 2023-08-17 (×3): qty 2

## 2023-08-17 MED ORDER — SODIUM CHLORIDE 0.9% FLUSH
3.0000 mL | Freq: Two times a day (BID) | INTRAVENOUS | Status: DC
  Administered 2023-08-18: 3 mL via INTRAVENOUS

## 2023-08-17 MED ORDER — NITROGLYCERIN 1 MG/10 ML FOR IR/CATH LAB
INTRA_ARTERIAL | Status: DC | PRN
  Administered 2023-08-17: 200 ug via INTRACORONARY

## 2023-08-17 MED ORDER — TICAGRELOR 90 MG PO TABS
90.0000 mg | ORAL_TABLET | Freq: Two times a day (BID) | ORAL | Status: DC
Start: 2023-08-17 — End: 2023-08-18
  Administered 2023-08-17 – 2023-08-18 (×2): 90 mg via ORAL
  Filled 2023-08-17 (×2): qty 1

## 2023-08-17 MED ORDER — LIDOCAINE HCL (PF) 1 % IJ SOLN
INTRAMUSCULAR | Status: AC
  Filled 2023-08-17: qty 30

## 2023-08-17 MED ORDER — NITROGLYCERIN 1 MG/10 ML FOR IR/CATH LAB
INTRA_ARTERIAL | Status: AC
  Filled 2023-08-17: qty 10

## 2023-08-17 MED ORDER — ACETAMINOPHEN 325 MG PO TABS
650.0000 mg | ORAL_TABLET | ORAL | Status: DC | PRN
  Administered 2023-08-17 (×2): 650 mg via ORAL
  Filled 2023-08-17 (×2): qty 2

## 2023-08-17 MED ORDER — VERAPAMIL HCL 2.5 MG/ML IV SOLN
INTRAVENOUS | Status: AC
  Filled 2023-08-17: qty 2

## 2023-08-17 MED ORDER — LABETALOL HCL 5 MG/ML IV SOLN: 10.0000 mg | INTRAVENOUS | Status: AC | PRN

## 2023-08-17 MED ORDER — MIDAZOLAM HCL 2 MG/2ML IJ SOLN
INTRAMUSCULAR | Status: DC | PRN
  Administered 2023-08-17: 1 mg via INTRAVENOUS

## 2023-08-17 SURGICAL SUPPLY — 12 items
BALLOON EMERGE MR 2.0X12 (BALLOONS) IMPLANT
BALLOON ~~LOC~~ EMERGE MR 2.75X12 (BALLOONS) IMPLANT
CATH 5FR JL3.5 JR4 ANG PIG MP (CATHETERS) IMPLANT
CATH VISTA GUIDE 6FR XBLD 3.5 (CATHETERS) IMPLANT
DEVICE RAD COMP TR BAND LRG (VASCULAR PRODUCTS) IMPLANT
GLIDESHEATH SLEND SS 6F .021 (SHEATH) IMPLANT
GUIDEWIRE INQWIRE 1.5J.035X260 (WIRE) IMPLANT
KIT ENCORE 26 ADVANTAGE (KITS) IMPLANT
PACK CARDIAC CATHETERIZATION (CUSTOM PROCEDURE TRAY) ×1 IMPLANT
SET ATX-X65L (MISCELLANEOUS) IMPLANT
STENT ONYX FRONTIER 2.5X22 (Permanent Stent) IMPLANT
WIRE ASAHI PROWATER 180CM (WIRE) IMPLANT
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
## 2023-08-17 NOTE — Progress Notes (Addendum)
 Notified by nurse that patient has dark tea/reddish color urine. No pain, VSS. Got IV heparin  earlier, now on DAPT s/p PCI for STEMI. Will obtain UA, H/H, and CK and follow. No foley this admission. ? R/t heparin . Hopefully should clear without intervention but would consult urology in AM if this persists. Also d/w Dr. Wonda.  Addendum H/H stable. UA c/w RBCs but also present on multiple prior UAs as well - hx kidney stones. Nitrite/leuk negative. Follow urine trends and repeat CBC in AM. Nurse reports patient also had reported flank pain similar to prior kidney stones, last one about 6 months ago, confirmed on CT with plan for OP urology follow-up at that time. Will try analgesic for this PM as ordered (already has oxycodone  APAP) with consideration of CT/urology input in AM if this persists. No other s/sx of RPH, no bruising, VSS. Update from nurse: patient wishes to hold off pain medication at this time, will notify if symptoms worsen.

## 2023-08-17 NOTE — Discharge Instructions (Signed)

## 2023-08-17 NOTE — Telephone Encounter (Signed)
 Patient Product/process development scientist completed.    The patient is insured through [facility] [place].     Ran test claim for Brilinta  90 mg and the current 30 day co-pay is $4.00.  Ran test claim for ticagrelor  (Brilinta ) 90 mg and Product Not Covered  This test claim was processed through [employer]- copay amounts may vary at other pharmacies due to [employer], or as the patient moves through the different stages of their insurance plan.     Reyes Sharps, CPHT Pharmacy Technician III Certified Patient Advocate [facility] Patient Advocate Team Direct Number: [phone]  Fax: [phone]

## 2023-08-17 NOTE — ED Provider Notes (Signed)
 [place] [facility] Provider Note   CSN: [id number] Arrival date & time: 08/17/23  9074     Patient presents with: Chest Pain   Leon Pena. is a 58 y.o. male.   Pt is a 58 yo male with pmhx significant for HTN, hypertriglyceridemia, depression, kidney stones, bph, and ED (on Cialis ).  Pt said he has cp that started this am around 0300.  It woke him from sleep.  He still has pain which is at a 7.  Pt denies sob. No n/v.  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       Prior to Admission medications   Medication Sig Start Date End Date Taking? Authorizing Provider  acyclovir  (ZOVIRAX ) 800 MG tablet Take 1 tablet (800 mg total) by mouth 5 (five) times daily. 02/24/23   Ula Prentice SAUNDERS, MD  Capsicum, Cayenne, (CAYENNE PO) Take by mouth daily.    [provider]  cyclobenzaprine  (FLEXERIL ) 10 MG tablet Take 1 tablet (10 mg total) by mouth 3 (three) times daily as needed for muscle spasms. 01/20/23   McElwee, Tinnie LABOR, NP  Ginger, Zingiber officinalis, (GINGER EXTRACT PO) daily.    [provider]  Multiple Vitamins-Minerals (EMERGEN-C IMMUNE PLUS PO) daily.    [provider]  oseltamivir  (TAMIFLU ) 75 MG capsule Take 1 capsule (75 mg total) by mouth every 12 (twelve) hours. Patient not taking: Reported on 04/08/2023 02/26/23   Carita Senior, MD  tadalafil  (CIALIS ) 5 MG tablet Take 1 tablet (5 mg total) by mouth daily. 07/20/23   McElwee, Tinnie LABOR, NP    Allergies: Latex, Compazine, and Promethazine hcl    Review of Systems  Cardiovascular:  Positive for chest pain.  All other systems reviewed and are negative.   Updated Vital Signs BP (!) 157/98   Pulse (!) 57   Temp 97.9 F (36.6 C)   Resp 17   Wt 113.4 kg   SpO2 97%   BMI 31.25 kg/m   Physical Exam Vitals and nursing note reviewed.  Constitutional:      Appearance: He is well-developed.  HENT:     Head: Normocephalic and atraumatic.   Eyes:     Extraocular Movements: Extraocular movements intact.     Pupils: Pupils are equal, round, and reactive to light.  Cardiovascular:     Rate and Rhythm: Regular rhythm. Bradycardia present.     Heart sounds: Normal heart sounds.  Pulmonary:     Effort: Pulmonary effort is normal.     Breath sounds: Normal breath sounds.  Abdominal:     General: Bowel sounds are normal.     Palpations: Abdomen is soft.  Musculoskeletal:        General: Normal range of motion.     Cervical back: Normal range of motion.  Skin:    General: Skin is warm.     Capillary Refill: Capillary refill takes less than 2 seconds.  Neurological:     General: No focal deficit present.     Mental Status: He is alert and oriented to person, place, and time.  Psychiatric:        Mood and Affect: Mood normal.        Behavior: Behavior normal.     (all labs ordered are listed, but only abnormal results are displayed) Labs Reviewed  CBC  PROTIME-INR  APTT  BASIC METABOLIC PANEL WITH GFR  HEMOGLOBIN A1C  LIPID PANEL  LACTIC ACID, PLASMA  TROPONIN T, HIGH SENSITIVITY    EKG: EKG Interpretation Date/Time:  Wednesday August 17 2023 09:38:18 EDT Ventricular Rate:  60 PR Interval:  202 QRS Duration:  90 QT Interval:  368 QTC Calculation: 368 R Axis:   79  Text Interpretation: Sinus rhythm Borderline prolonged PR interval ST elevation, consider lateral injury EKG with new inf and lat lead elevations concerning for STEMI. Confirmed by Dean Clarity [phone]) on 08/17/2023 10:03:04 AM  Radiology: XR KNEE 3 VIEW RIGHT Result Date: 08/16/2023 X-rays of the right knee show mild degenerative changes and periarticular spurring.  No acute abnormalities.    Procedures   Medications Ordered in the ED  0.9 %  sodium chloride  infusion ( Intravenous New Bag/Given 08/17/23 0958)  aspirin  chewable tablet 324 mg (324 mg Oral Given 08/17/23 0951)  heparin  injection 4,000 Units (4,000 Units Intravenous Given 08/17/23  9047)                                    Medical Decision Making Amount and/or Complexity of Data Reviewed Labs: ordered. Radiology: ordered.  Risk OTC drugs. Prescription drug management.   This patient presents to the ED for concern of cp, this involves an extensive number of treatment options, and is a complaint that carries with it a high risk of complications and morbidity.  The differential diagnosis includes stemi, nstemi, pulm, gi   Co morbidities that complicate the patient evaluation   HTN, hypertriglyceridemia, depression, kidney stones, bph, and ED (on Cialis )   Additional history obtained:  Additional history obtained from epic chart review  Lab Tests:  I Ordered, and personally interpreted labs.  The pertinent results include:  cbc nl.  Other labs pending at the time of tx.   Imaging Studies ordered:  I ordered imaging studies including cxr  I independently visualized and interpreted imaging which looks nl. Radiology has still not read it by the time care link arrived.    Cardiac Monitoring:  The patient was maintained on a cardiac monitor.  I personally viewed and interpreted the cardiac monitored which showed an underlying rhythm of: sb with ST elevation   Medicines ordered and prescription drug management:  I ordered medication including heparin  and asa  for sx  Reevaluation of the patient after these medicines showed that the patient improved I have reviewed the patients home medicines and have made adjustments as needed   Test Considered:  ct   Critical Interventions:  Code stemi   Consultations Obtained:  I requested consultation with the STEMI doc (Dr. Verlin),  and discussed lab and imaging findings as well as pertinent plan - he recommends calling a code stemi  Problem List / ED Course:  STEMI: pt with st elevation and active CP.  Pt unable to take nitroglycerin  due to cialis  use.  Pt does not want morphine.  Code stemi  activated.  Pt transferred to [facility] cath lab via care link.   Reevaluation:  After the interventions noted above, I reevaluated the patient and found that they have :improved   Social Determinants of Health:  Lives at home   Dispostion:  After consideration of the diagnostic results and the patients response to treatment, I feel that the patent would benefit from tx to cath lab at [facility].    CRITICAL CARE Performed by: Clarity Dean   Total critical care time: 30 minutes  Critical care time was exclusive of separately billable procedures  and treating other patients.  Critical care was necessary to treat or prevent imminent or life-threatening deterioration.  Critical care was time spent personally by me on the following activities: development of treatment plan with patient and/or surrogate as well as nursing, discussions with consultants, evaluation of patient's response to treatment, examination of patient, obtaining history from patient or surrogate, ordering and performing treatments and interventions, ordering and review of laboratory studies, ordering and review of radiographic studies, pulse oximetry and re-evaluation of patient's condition.      Final diagnoses:  ST elevation myocardial infarction (STEMI), unspecified artery [facility])    ED Discharge Orders     None          Dean Clarity, MD 08/17/23 1026

## 2023-08-17 NOTE — ED Notes (Signed)
 [facility] arrived for pt transport to [facility]

## 2023-08-17 NOTE — ED Triage Notes (Signed)
 Pulling and pressure to central chest that started this morning , headache , NV and bilateral upper this pain .  Hx GERD .

## 2023-08-17 NOTE — ED Notes (Signed)
Report given to Cath lab.

## 2023-08-18 ENCOUNTER — Other Ambulatory Visit (HOSPITAL_COMMUNITY): Payer: Self-pay

## 2023-08-18 ENCOUNTER — Encounter (HOSPITAL_COMMUNITY): Payer: Self-pay | Admitting: Cardiovascular Disease

## 2023-08-18 ENCOUNTER — Telehealth: Payer: Self-pay | Admitting: Cardiology

## 2023-08-18 DIAGNOSIS — R519 Headache, unspecified: Secondary | ICD-10-CM | POA: Insufficient documentation

## 2023-08-18 DIAGNOSIS — E785 Hyperlipidemia, unspecified: Secondary | ICD-10-CM | POA: Insufficient documentation

## 2023-08-18 DIAGNOSIS — I2119 ST elevation (STEMI) myocardial infarction involving other coronary artery of inferior wall: Secondary | ICD-10-CM | POA: Diagnosis not present

## 2023-08-18 LAB — CBC
HCT: 44.5 % (ref 39.0–52.0)
Hemoglobin: 15.1 g/dL (ref 13.0–17.0)
MCH: 30.1 pg (ref 26.0–34.0)
MCHC: 33.9 g/dL (ref 30.0–36.0)
MCV: 88.6 fL (ref 80.0–100.0)
Platelets: 240 K/uL (ref 150–400)
RBC: 5.02 MIL/uL (ref 4.22–5.81)
RDW: 12.5 % (ref 11.5–15.5)
WBC: 8.2 K/uL (ref 4.0–10.5)
nRBC: 0 % (ref 0.0–0.2)

## 2023-08-18 LAB — BASIC METABOLIC PANEL WITH GFR
Anion gap: 12 (ref 5–15)
BUN: 13 mg/dL (ref 6–20)
CO2: 22 mmol/L (ref 22–32)
Calcium: 9 mg/dL (ref 8.9–10.3)
Chloride: 102 mmol/L (ref 98–111)
Creatinine, Ser: 0.99 mg/dL (ref 0.61–1.24)
GFR, Estimated: >60 mL/min (ref >60)
Glucose, Bld: 127 mg/dL — ABNORMAL HIGH (ref 70–99)
Potassium: 4.1 mmol/L (ref 3.5–5.1)
Sodium: 136 mmol/L (ref 135–145)

## 2023-08-18 LAB — HEMOGLOBIN A1C
Hgb A1c MFr Bld: 5.6 % (ref 4.8–5.6)
Mean Plasma Glucose: 114 mg/dL

## 2023-08-18 MED ORDER — TICAGRELOR 90 MG PO TABS
90.0000 mg | ORAL_TABLET | Freq: Two times a day (BID) | ORAL | 11 refills | Status: AC
Start: 2023-08-18 — End: ?
  Filled 2023-08-18: qty 60, 30d supply, fill #0

## 2023-08-18 MED ORDER — BUTALBITAL-APAP-CAFFEINE 50-325-40 MG PO TABS: 2.0000 | ORAL_TABLET | Freq: Once | ORAL | Status: DC

## 2023-08-18 MED ORDER — NITROGLYCERIN 0.4 MG SL SUBL
0.4000 mg | SUBLINGUAL_TABLET | SUBLINGUAL | 3 refills | Status: AC | PRN
  Filled 2023-08-18: qty 25, 1d supply, fill #0

## 2023-08-18 MED ORDER — TADALAFIL 5 MG PO TABS: 5.0000 mg | ORAL_TABLET | Freq: Every day | ORAL | Status: DC | PRN

## 2023-08-18 MED ORDER — ROSUVASTATIN CALCIUM 20 MG PO TABS
20.0000 mg | ORAL_TABLET | Freq: Every day | ORAL | Status: DC
  Administered 2023-08-18: 20 mg via ORAL
  Filled 2023-08-18: qty 1

## 2023-08-18 MED ORDER — ROSUVASTATIN CALCIUM 20 MG PO TABS
20.0000 mg | ORAL_TABLET | Freq: Every day | ORAL | 0 refills | Status: DC
  Filled 2023-08-18: qty 90, 90d supply, fill #0

## 2023-08-18 MED ORDER — ASPIRIN 81 MG PO TBEC
81.0000 mg | DELAYED_RELEASE_TABLET | Freq: Every day | ORAL | 3 refills | Status: AC
  Filled 2023-08-18: qty 90, 90d supply, fill #0

## 2023-08-18 NOTE — Telephone Encounter (Signed)
   Transition of Care Follow-up Phone Call Request    Patient Name: Leon Pena. Date of Birth: [birth year] Date of Encounter: 08/18/2023  Primary Care Provider:  [name], NP Primary Cardiologist:  Lonni Cash, MD  [name] has been scheduled for a transition of care follow up appointment with a [facility] provider:  [facility] 7/23  Please reach out to [name] within 48 hours of discharge to confirm appointment and review transition of care protocol questionnaire. Anticipated discharge date: 7/10  Manuelita Rummer, NP  08/18/2023, 1:39 PM

## 2023-08-18 NOTE — Progress Notes (Signed)
 Preparing pt for discharge, iv removed, cath intact, discharge instructions reviewed with pt, pt verbalizes understanding. Pt to pick up meds in [facility] pharmacy. Awaiting transport

## 2023-08-18 NOTE — Progress Notes (Signed)
 Pt w/ severe L sided flank pain that briefly improved to moderate pain after 10mg  Roxicodone  (administered twice throughout shift) as well as tea colored urine. Pt offered additional dose of roxi ~ 6:45 but stated he would hold off on additional pain med for now. Nat[employer] Insurance underwriter) updated at 7:00 handoff

## 2023-08-18 NOTE — TOC CM/SW Note (Signed)
 Transition of Care [facility]) - Inpatient Brief Assessment   Patient Details  Name: Leon Pena. MRN: [id number] Date of Birth: 1965/07/10  Transition of Care [facility]) CM/SW Contact:    Sudie Erminio Deems, RN Phone Number: 08/18/2023, 12:06 PM   Clinical Narrative: Patient presented for chest pain-post LHC. PTA patient states he is from home  alone and has support of brother. Patient has PCP and has transportation to appointments. Medications range from $4.00-$4.50. Case Manager will continue to follow for additional needs.   Transition of Care Asessment: Insurance and Status: Insurance coverage has been reviewed Patient has primary care physician: Yes Home environment has been reviewed: reviewedd Prior level of function:: independent Prior/Current Home Services: No current home services Social Drivers of Health Review: SDOH reviewed no interventions necessary Readmission risk has been reviewed: Yes Transition of care needs: no transition of care needs at this time

## 2023-08-18 NOTE — Plan of Care (Signed)
   Problem: Education: Goal: Understanding of CV disease, CV risk reduction, and recovery process will improve Outcome: Progressing   Problem: Activity: Goal: Ability to return to baseline activity level will improve Outcome: Progressing   Problem: Cardiovascular: Goal: Ability to achieve and maintain adequate cardiovascular perfusion will improve Outcome: Progressing Goal: Vascular access site(s) Level 0-1 will be maintained Outcome: Progressing   Problem: Health Behavior/Discharge Planning: Goal: Ability to safely manage health-related needs after discharge will improve Outcome: Progressing

## 2023-08-18 NOTE — Progress Notes (Signed)
 CARDIAC REHAB PHASE I   PRE:  Rate/Rhythm: 54 SR   BP:  Sitting: 130/73      SaO2: 98 RA   MODE:  Ambulation: 20 ft   POST:  Rate/Rhythm: 65 SR  BP:  Sitting: 144/92      SaO2: 96 RA   Pt resting in bed, feeling ok with complaint of mild headache. Pt assisted with walk in room, complaint of increased dizziness and headache. Pt was assisted to chair at room doorway and wheeled back to bed. Pt in bed with call bell bedside table in reach. Pt continues to complain of headache, dizziness and onset of nausea. RN at bedside and aware. Post MI/stent education including restrictions, risk factors, exercise guidelines, antiplatelet therapy importance, MI booklet, NTG use, heart healthy diet,  tobacco cessation and CRP2 reviewed. All questions and concerns addressed. Will refer to [facility] for CRP2. Will continue to follow.   [id number] Vaughn Asberry Hacking, RN BSN 08/18/2023 10:37 AM

## 2023-08-18 NOTE — Progress Notes (Addendum)
   08/18/23 1203  Spiritual Encounters  Type of Visit Initial  Care provided to: Patient  Referral source Clinical staff  Reason for visit Advance directives  OnCall Visit No  Spiritual Framework  Presenting Themes Goals in life/care  Patient Stress Factors Health changes  Goals  Self/Personal Goals Complete Advance Directive  Interventions  Spiritual Care Interventions Made Compassionate presence;Established relationship of care and support  Intervention Outcomes  Outcomes Awareness of support;Awareness of health   Chaplain was present with the Advance Directive (AD) during encounter with Pt. Pt appeared slightly under the weather but was alert and responsive. Pt expressed interest in completing an AD but stated he was no ready at this time due to how Pt. Was feeling. (AD package left with Pt.) per Pt. Request.  He inquired whether he could contact [employer] when he felt ready to proceed. Pt was assured that spiritual care is available upon request and may reach out when ready.

## 2023-08-18 NOTE — Discharge Summary (Addendum)
 Discharge Summary   Patient ID: Leon Pena. MRN: [id number]; DOB: 1965-06-22  Admit date: 08/17/2023 Discharge date: 08/18/2023  PCP:  [name], NP   [facility] Providers Cardiologist:  Lonni Cash, MD      Discharge Diagnoses  Principal Problem:   ST elevation myocardial infarction (STEMI) of inferolateral wall [facility]) Active Problems:   HTN (hypertension)   Kidney stones   Hyperlipidemia   Headache   Diagnostic Studies/Procedures   Cath: 08/17/2023    2nd Mrg lesion is 90% stenosed.   Prox Cx to Mid Cx lesion is 40% stenosed.   Ost Cx lesion is 40% stenosed.   A drug-eluting stent was successfully placed using a STENT ONYX FRONTIER 2.5X22.   Post intervention, there is a 0% residual stenosis.   The left ventricular ejection fraction is 55-65% by visual estimate.   Acute inferolateral STEMI secondary to thrombotic stenosis of the first obtuse marginal branch (hazy 80-90% stenosis) Successful PTCA/DES x 1 second obtuse marginal branch No obstructive disease in the LAD or RCA LVEDP 12 mmHg Normal LV systolic function   Recommendations: Admit to telemetry. DAPT with ASA and Brilinta  for one year. High intensity statin. Echo today. Fast track discharge tomorrow  Diagnostic Dominance: Right  Intervention   _____________   History of Present Illness   Leon Pena. is a 58 y.o. 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 EKG with inferior ST elevation. Code STEMI called by ED staff. Pt having chest pain upon arrival to [facility]. Pt with no prior cardiac disease.    Hospital Course    STEMI -- Underwent cardiac catheterization with thrombotic stenosis of first OM treated with PCI/DES x 1, nonobstructive disease in the LAD and RCA treated medically.  Recommendations for DAPT with aspirin /Brilinta  for at least 1 year.  Echocardiogram showed LVEF of 60  to 65%, grade 1 diastolic dysfunction, normal RV, no significant valvular disease. Seen by cardiac rehab -- Continue aspirin , Brilinta , statin  HTN -- Pressures intermittently elevated during admission though patient adamantly refuses to be on antihypertensives at this time  HLD -- LDL 91, HDL 55, LP (a) pending -- Discussed the need for high intensity statin though patient initially refused, was agreeable to Crestor  20mg  daily. Thinks he was on lipitor in the past and didn't tolerate. -- FLP/LFTs in 8 weeks  Flank pain Hx of renal stones History of right nephrectomy -- Previous CT renal study 02/2023 with multiple nonobstructing left renal calculi measuring up to 12 mm.  Did have some left-sided flank pain with post PCI. Improved prior to discharge -- Stable hemoglobin, CK within normal limits -- he will follow up outpatient with urology  Headache -- complained of generalized headache with no improvement after receiving oxy. Improved with floricet prior to discharge  General: Well developed, well nourished, male appearing in no acute distress. Head: Normocephalic, atraumatic.  Neck: Supple without bruits, JVD. Lungs:  Resp regular and unlabored, CTA. Heart: RRR, S1, S2, no S3, S4, or murmur; no rub. Abdomen: Soft, non-tender, non-distended with normoactive bowel sounds.  Extremities: No clubbing, cyanosis, edema. Distal pedal pulses are 2+ bilaterally. Right radial cath site stable without bruising or hematoma Neuro: Alert and oriented X 3. Moves all extremities spontaneously. Psych: Normal affect.  Patient seen by myself and Dr. Wonda and he is stable for discharge home.  Follow-up arranged in the office.  Medication sent to [facility] pharmacy.  Educated by [employer].D. prior to discharge.  Did the patient have an acute coronary syndrome (MI, NSTEMI, STEMI, etc) this admission?:  Yes                               AHA/ACC ACS Clinical Performance & Quality Measures: Aspirin  prescribed? -  Yes ADP Receptor Inhibitor (Plavix/Clopidogrel, Brilinta /Ticagrelor  or Effient/Prasugrel) prescribed (includes medically managed patients)? - Yes Beta Blocker prescribed? - No. The patient has an EF >/= 50%. Based upon the [facility] Study pub in Apr 2024, there is no benefit in patients with preserved EF post MI. High Intensity Statin (Lipitor 40-80mg  or Crestor  20-40mg ) prescribed? - Yes EF assessed during THIS hospitalization? - Yes For EF <40%, was ACEI/ARB prescribed? - Not Applicable (EF >/= 40%) For EF <40%, Aldosterone Antagonist (Spironolactone or Eplerenone) prescribed? - Not Applicable (EF >/= 40%) Cardiac Rehab Phase II ordered (including medically managed patients)? - Yes       The patient will be scheduled for a TOC follow up appointment in 10-14 days.  A message has been sent to the [facility] and Scheduling Pool at the office where the patient should be seen for follow up.  _____________  Discharge Vitals Blood pressure 132/79, pulse (!) 54, temperature 97.7 F (36.5 C), temperature source Oral, resp. rate 18, height 6' 3 (1.905 m), weight 113.4 kg, SpO2 100%.  Filed Weights   08/17/23 0932 08/17/23 1440  Weight: 113.4 kg 113.4 kg    Labs & Radiologic Studies  CBC Recent Labs    08/17/23 0948 08/17/23 1828 08/18/23 0435  WBC 7.2  --  8.2  HGB 15.7 15.6 15.1  HCT 45.1 45.0 44.5  MCV 86.7  --  88.6  PLT 247  --  240   Basic Metabolic Panel Recent Labs    92/90/74 0948 08/18/23 0435  NA 138 136  K 4.4 4.1  CL 102 102  CO2 22 22  GLUCOSE 123* 127*  BUN 11 13  CREATININE 0.93 0.99  CALCIUM  9.6 9.0   Liver Function Tests No results for input(s): AST, ALT, ALKPHOS, BILITOT, PROT, ALBUMIN in the last 72 hours. No results for input(s): LIPASE, AMYLASE in the last 72 hours. High Sensitivity Troponin:   No results for input(s): TROPONINIHS in the last 720 hours.  Recent Labs  Lab 08/17/23 0948  TRNPT <15    BNP Invalid input(s):  POCBNP No results for input(s): PROBNP in the last 72 hours.  No results for input(s): BNP in the last 72 hours.  D-Dimer No results for input(s): DDIMER in the last 72 hours. Hemoglobin A1C Recent Labs    08/17/23 0948  HGBA1C 5.6   Fasting Lipid Panel Recent Labs    08/17/23 0948  CHOL 166  HDL 55  LDLCALC 81  TRIG 151*  CHOLHDL 3.0   No results found for: LIPOA  Thyroid Function Tests No results for input(s): TSH, T4TOTAL, T3FREE, THYROIDAB in the last 72 hours.  Invalid input(s): FREET3 _____________  ECHOCARDIOGRAM COMPLETE Result Date: 08/17/2023    ECHOCARDIOGRAM REPORT   Patient Name:   Leon Pena. Date of Exam: 08/17/2023 Medical Rec #:  [id number]          Height:       75.0 in Accession #:    [id number]         Weight:       250.0 lb Date of  Birth:  [birth year]           BSA:          2.413 m Patient Age:    58 years           BP:           139/81 mmHg Patient Gender: M                  HR:           60 bpm. Exam Location:  Inpatient Procedure: 2D Echo, Cardiac Doppler and Color Doppler (Both Spectral and Color            Flow Doppler were utilized during procedure). Indications:    CAD Native Vessel I25.10  History:        Patient has no prior history of Echocardiogram examinations.  Sonographer:    Tinnie Gosling RDCS Referring Phys: 61 CHRISTOPHER D MCALHANY IMPRESSIONS  1. Left ventricular ejection fraction, by estimation, is 60 to 65%. The left ventricle has normal function. The left ventricle has no regional wall motion abnormalities. There is mild concentric left ventricular hypertrophy. Left ventricular diastolic parameters are consistent with Grade I diastolic dysfunction (impaired relaxation).  2. Right ventricular systolic function is normal. The right ventricular size is normal.  3. The mitral valve is normal in structure. No evidence of mitral valve regurgitation. No evidence of mitral stenosis.  4. The aortic valve has an indeterminant number  of cusps. Aortic valve regurgitation is not visualized. No aortic stenosis is present.  5. The inferior vena cava is normal in size with greater than 50% respiratory variability, suggesting right atrial pressure of 3 mmHg. Comparison(s): No prior Echocardiogram. FINDINGS  Left Ventricle: Left ventricular ejection fraction, by estimation, is 60 to 65%. The left ventricle has normal function. The left ventricle has no regional wall motion abnormalities. The left ventricular internal cavity size was normal in size. There is  mild concentric left ventricular hypertrophy. Left ventricular diastolic parameters are consistent with Grade I diastolic dysfunction (impaired relaxation). Right Ventricle: The right ventricular size is normal. Right ventricular systolic function is normal. Left Atrium: Left atrial size was normal in size. Right Atrium: Right atrial size was normal in size. Pericardium: There is no evidence of pericardial effusion. Mitral Valve: The mitral valve is normal in structure. No evidence of mitral valve regurgitation. No evidence of mitral valve stenosis. Tricuspid Valve: The tricuspid valve is normal in structure. Tricuspid valve regurgitation is trivial. No evidence of tricuspid stenosis. Aortic Valve: The aortic valve has an indeterminant number of cusps. Aortic valve regurgitation is not visualized. No aortic stenosis is present. Pulmonic Valve: The pulmonic valve was not well visualized. Pulmonic valve regurgitation is not visualized. No evidence of pulmonic stenosis. Aorta: The aortic root is normal in size and structure. Venous: The inferior vena cava is normal in size with greater than 50% respiratory variability, suggesting right atrial pressure of 3 mmHg. IAS/Shunts: No atrial level shunt detected by color flow Doppler.  LEFT VENTRICLE PLAX 2D LVIDd:         4.60 cm   Diastology LVIDs:         2.60 cm   LV e' medial:    7.51 cm/s LV PW:         1.30 cm   LV E/e' medial:  7.9 LV IVS:        1.20  cm   LV e' lateral:   9.68 cm/s LVOT diam:  2.10 cm   LV E/e' lateral: 6.2 LV SV:         60 LV SV Index:   25 LVOT Area:     3.46 cm  RIGHT VENTRICLE             IVC RV S prime:     11.30 cm/s  IVC diam: 2.10 cm TAPSE (M-mode): 2.1 cm LEFT ATRIUM           Index LA diam:      3.00 cm 1.24 cm/m LA Vol (A4C): 37.6 ml 15.58 ml/m  AORTIC VALVE LVOT Vmax:   92.60 cm/s LVOT Vmean:  60.500 cm/s LVOT VTI:    0.172 m  AORTA Ao Root diam: 3.70 cm MITRAL VALVE MV Area (PHT): 3.10 cm    SHUNTS MV Decel Time: 245 msec    Systemic VTI:  0.17 m MV E velocity: 59.70 cm/s  Systemic Diam: 2.10 cm MV A velocity: 63.90 cm/s MV E/A ratio:  0.93 Redell Shallow MD Electronically signed by Redell Shallow MD Signature Date/Time: 08/17/2023/3:25:05 PM    Final    CARDIAC CATHETERIZATION Result Date: 08/17/2023   2nd Mrg lesion is 90% stenosed.   Prox Cx to Mid Cx lesion is 40% stenosed.   Ost Cx lesion is 40% stenosed.   A drug-eluting stent was successfully placed using a STENT ONYX FRONTIER 2.5X22.   Post intervention, there is a 0% residual stenosis.   The left ventricular ejection fraction is 55-65% by visual estimate. Acute inferolateral STEMI secondary to thrombotic stenosis of the first obtuse marginal branch (hazy 80-90% stenosis) Successful PTCA/DES x 1 second obtuse marginal branch No obstructive disease in the LAD or RCA LVEDP 12 mmHg Normal LV systolic function Recommendations: Admit to telemetry. DAPT with ASA and Brilinta  for one year. High intensity statin. Echo today. Fast track discharge tomorrow   DG Chest Port 1 View Result Date: 08/17/2023 CLINICAL DATA:  Chest pain. EXAM: PORTABLE CHEST 1 VIEW COMPARISON:  02/25/2023. FINDINGS: The heart size and mediastinal contours are within normal limits. No focal consolidation, pleural effusion, or pneumothorax. No acute osseous abnormality. IMPRESSION: No acute cardiopulmonary findings. Electronically Signed   By: Harrietta Sherry M.D.   On: 08/17/2023 10:44   XR KNEE 3  VIEW RIGHT Result Date: 08/16/2023 X-rays of the right knee show mild degenerative changes and periarticular spurring.  No acute abnormalities.   Disposition Pt is being discharged home today in good condition.  Follow-up Plans & Appointments  Discharge Instructions     Amb Referral to Cardiac Rehabilitation   Complete by: As directed    Diagnosis:  Coronary Stents STEMI     After initial evaluation and assessments completed: Virtual Based Care may be provided alone or in conjunction with Phase 2 Cardiac Rehab based on patient barriers.: Yes   Intensive Cardiac Rehabilitation (ICR) [facility] location only OR Traditional Cardiac Rehabilitation (TCR) *If criteria for ICR are not met will enroll in TCR [facility] only): Yes   Call MD for:  difficulty breathing, headache or visual disturbances   Complete by: As directed    Call MD for:  persistant dizziness or light-headedness   Complete by: As directed    Call MD for:  redness, tenderness, or signs of infection (pain, swelling, redness, odor or green/yellow discharge around incision site)   Complete by: As directed    Diet - low sodium heart healthy   Complete by: As directed    Discharge instructions   Complete by: As directed  Radial Site Care Refer to this sheet in the next few weeks. These instructions provide you with information on caring for yourself after your procedure. Your caregiver may also give you more specific instructions. Your treatment has been planned according to current medical practices, but problems sometimes occur. Call your caregiver if you have any problems or questions after your procedure. HOME CARE INSTRUCTIONS You may shower the day after the procedure. Remove the bandage (dressing) and gently wash the site with plain soap and water. Gently pat the site dry.  Do not apply powder or lotion to the site.  Do not submerge the affected site in water for 3 to 5 days.  Inspect the site at least twice daily.  Do not flex or  bend the affected arm for 24 hours.  No lifting over 5 pounds (2.3 kg) for 5 days after your procedure.  Do not drive home if you are discharged the same day of the procedure. Have someone else drive you.  You may drive 24 hours after the procedure unless otherwise instructed by your caregiver.  What to expect: Any bruising will usually fade within 1 to 2 weeks.  Blood that collects in the tissue (hematoma) may be painful to the touch. It should usually decrease in size and tenderness within 1 to 2 weeks.  SEEK IMMEDIATE MEDICAL CARE IF: You have unusual pain at the radial site.  You have redness, warmth, swelling, or pain at the radial site.  You have drainage (other than a small amount of blood on the dressing).  You have chills.  You have a fever or persistent symptoms for more than 72 hours.  You have a fever and your symptoms suddenly get worse.  Your arm becomes pale, cool, tingly, or numb.  You have heavy bleeding from the site. Hold pressure on the site.   PLEASE DO NOT MISS ANY DOSES OF YOUR BRILINTA !!!!! Also keep a log of you blood pressures and bring back to your follow up appt. Please call the office with any questions.   Patients taking blood thinners should generally stay away from medicines like ibuprofen , Advil , Motrin , naproxen , and Aleve  due to risk of stomach bleeding. You may take Tylenol  as directed or talk to your primary doctor about alternatives.   PLEASE ENSURE THAT YOU DO NOT RUN OUT OF YOUR BRILINTA . This medication is very important to remain on for at least one year. IF you have issues obtaining this medication due to cost please CALL the office 3-5 business days prior to running out in order to prevent missing doses of this medication.   Increase activity slowly   Complete by: As directed        Discharge Medications Allergies as of 08/18/2023       Reactions   Latex Shortness Of Breath, Swelling   Compazine Other (See Comments)   seziures    Promethazine Hcl Other (See Comments)   seziures        Medication List     STOP taking these medications    CAYENNE PO   GINGER EXTRACT PO       TAKE these medications    aspirin  EC 81 MG tablet Take 1 tablet (81 mg total) by mouth daily. Swallow whole.   Brilinta  90 MG Tabs tablet Generic drug: ticagrelor  Take 1 tablet (90 mg total) by mouth 2 (two) times daily.   cyclobenzaprine  10 MG tablet Commonly known as: FLEXERIL  Take 1 tablet (10 mg total) by mouth 3 (three)  times daily as needed for muscle spasms.   EMERGEN-C IMMUNE PLUS PO Take 1 tablet by mouth daily.   nitroGLYCERIN  0.4 MG SL tablet Commonly known as: Nitrostat  Place 1 tablet (0.4 mg total) under the tongue every 5 (five) minutes as needed for chest pain.   rosuvastatin  20 MG tablet Commonly known as: CRESTOR  Take 1 tablet (20 mg total) by mouth daily.   tadalafil  5 MG tablet Commonly known as: CIALIS  Take 1 tablet (5 mg total) by mouth daily as needed for erectile dysfunction. What changed:  when to take this reasons to take this         Outstanding Labs/Studies  FLP/LFTs in 8 weeks  Duration of Discharge Encounter: APP Time: 20 minutes   Signed, Manuelita Rummer, NP 08/18/2023, 1:39 PM  Patient seen, examined. Available data reviewed. Agree with findings, assessment, and plan as outlined by Manuelita Rummer, NP.  Patient is independently interviewed and examined.  His brother is at the bedside.  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  HEENT is normal, JVP is normal, lungs are clear bilaterally, heart is regular rate and rhythm no murmur gallop, abdomen soft nontender, extremities have no edema, right radial cath site is clear with no hematoma or ecchymosis.  Patient's echocardiogram is reviewed and demonstrates an LVEF of 60 to 65%, normal RV function, no regional wall motion abnormalities of the LV, and no significant valvular disease.  Cardiac catheterization  demonstrated single-vessel coronary artery disease with thrombotic severe stenosis of the OM branch of the circumflex treated with PCI.  The patient's LVEDP was normal.  His medical regimen is reviewed and he is on post MI medical therapy with aspirin , ticagrelor , and rosuvastatin .  The patient is normotensive with normal LVEF, so no indication for ACE/ARB or beta-blocker.  He complains of a headache this morning and then some nausea and dizziness after taking oxycodone  overnight.  I suspect he is having some side effects from taking oxycodone .  Advised him to go ahead and have lunch, make sure he tolerates eating and getting up ambulating this afternoon.  As long as his symptoms are improving, he should be medically stable for discharge today.  He has no focal neurologic symptoms or deficits.  MD time conducting this discharge is 25 minutes and includes my personal exam of the patient, counseling regarding post MI restrictions and medical therapy, and review of his studies including his cardiac catheterization, echo, and lab work.  Ozell Fell, M.D. 08/18/2023 1:39 PM

## 2023-08-19 NOTE — Telephone Encounter (Signed)
 Left message for patient to call back

## 2023-08-20 DIAGNOSIS — Z419 Encounter for procedure for purposes other than remedying health state, unspecified: Secondary | ICD-10-CM | POA: Diagnosis not present

## 2023-08-20 LAB — LIPOPROTEIN A (LPA): Lipoprotein (a): 19.5 nmol/L (ref <75.0)

## 2023-08-22 ENCOUNTER — Telehealth: Payer: Self-pay | Admitting: Cardiovascular Disease

## 2023-08-22 NOTE — Telephone Encounter (Signed)
 Pt calling to make provider aware that he will be dropping off FMLA paper today. Pt also needs documentation/note for employer of current Stent placement as well. Pt would ike a c/b regarding this matter.

## 2023-08-22 NOTE — Telephone Encounter (Signed)
 TOC call, no answer, left message asking recipient to call our [place] at our office # per [facility].

## 2023-08-22 NOTE — Telephone Encounter (Signed)
 Pt dropped off Family medical leave paperwork to be filled out and faxed in. Fax number is highlighted.   Location: Dr. Verlin baize

## 2023-08-22 NOTE — Telephone Encounter (Signed)
 Spoke with Leon Pena who states he needs a letter for his employer stating his diagnosis and return appointment date.  He will drop off his FMLA paperwork and his scheduled to see Jon Hails on 08/31/23.  Leon Pena advised RN will type letter as requested and he may pick up letter on the 5th floor.  Leon Pena verbalizes understanding and agrees with current plan.

## 2023-08-23 ENCOUNTER — Telehealth (HOSPITAL_COMMUNITY): Payer: Self-pay

## 2023-08-23 NOTE — Telephone Encounter (Signed)
 Called patient to see if he is interested in the Cardiac Rehab Program- no answer, left message to call us  back. Sent MyChart message.  Patient f/u is on 7/23.

## 2023-08-24 NOTE — Telephone Encounter (Signed)
 Patient contacted regarding discharge from [facility] on 08/18/23 Patient understands to follow up with provider Jon Hails on 08/31/23 at 2:45 pm Patient understands discharge instructions? Yes Patient understands medications and regiment? Yes  Patient understands to bring all medications to this visit? Yes  Pt reports no issues with wrist cath site.  No numbness or tingling,  site is open to air, cdi.

## 2023-08-25 ENCOUNTER — Telehealth (HOSPITAL_COMMUNITY): Payer: Self-pay

## 2023-08-25 NOTE — Telephone Encounter (Signed)
 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 F/u on 7/23.
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
## 2023-08-31 ENCOUNTER — Encounter: Payer: Self-pay | Admitting: Physician Assistant

## 2023-08-31 ENCOUNTER — Ambulatory Visit: Payer: Self-pay | Admitting: Nurse Practitioner

## 2023-08-31 ENCOUNTER — Ambulatory Visit: Admitting: Nurse Practitioner

## 2023-08-31 ENCOUNTER — Encounter: Payer: Self-pay | Admitting: Nurse Practitioner

## 2023-08-31 ENCOUNTER — Encounter: Payer: Self-pay | Admitting: Cardiovascular Disease

## 2023-08-31 ENCOUNTER — Ambulatory Visit: Attending: Cardiovascular Disease | Admitting: Physician Assistant

## 2023-08-31 VITALS — BP 132/80 | HR 72 | Temp 97.7°F | Ht 75.0 in | Wt 261.0 lb

## 2023-08-31 VITALS — BP 122/78 | HR 75 | Ht 75.0 in

## 2023-08-31 DIAGNOSIS — Z1211 Encounter for screening for malignant neoplasm of colon: Secondary | ICD-10-CM | POA: Diagnosis not present

## 2023-08-31 DIAGNOSIS — R3989 Other symptoms and signs involving the genitourinary system: Secondary | ICD-10-CM | POA: Diagnosis not present

## 2023-08-31 DIAGNOSIS — N400 Enlarged prostate without lower urinary tract symptoms: Secondary | ICD-10-CM | POA: Diagnosis not present

## 2023-08-31 DIAGNOSIS — E785 Hyperlipidemia, unspecified: Secondary | ICD-10-CM

## 2023-08-31 DIAGNOSIS — R310 Gross hematuria: Secondary | ICD-10-CM

## 2023-08-31 DIAGNOSIS — Z0000 Encounter for general adult medical examination without abnormal findings: Secondary | ICD-10-CM

## 2023-08-31 DIAGNOSIS — I10 Essential (primary) hypertension: Secondary | ICD-10-CM

## 2023-08-31 DIAGNOSIS — Z789 Other specified health status: Secondary | ICD-10-CM | POA: Diagnosis not present

## 2023-08-31 DIAGNOSIS — R5383 Other fatigue: Secondary | ICD-10-CM | POA: Diagnosis not present

## 2023-08-31 DIAGNOSIS — M79604 Pain in right leg: Secondary | ICD-10-CM | POA: Diagnosis not present

## 2023-08-31 DIAGNOSIS — R0602 Shortness of breath: Secondary | ICD-10-CM

## 2023-08-31 DIAGNOSIS — I2510 Atherosclerotic heart disease of native coronary artery without angina pectoris: Secondary | ICD-10-CM | POA: Diagnosis not present

## 2023-08-31 DIAGNOSIS — Z9861 Coronary angioplasty status: Secondary | ICD-10-CM | POA: Diagnosis not present

## 2023-08-31 DIAGNOSIS — I252 Old myocardial infarction: Secondary | ICD-10-CM

## 2023-08-31 DIAGNOSIS — I2119 ST elevation (STEMI) myocardial infarction involving other coronary artery of inferior wall: Secondary | ICD-10-CM | POA: Diagnosis not present

## 2023-08-31 LAB — CBC WITH DIFFERENTIAL/PLATELET
Basophils Absolute: 0.1 K/uL (ref 0.0–0.1)
Basophils Relative: 1.1 % (ref 0.0–3.0)
Eosinophils Absolute: 0.3 K/uL (ref 0.0–0.7)
Eosinophils Relative: 3.5 % (ref 0.0–5.0)
HCT: 45.3 % (ref 39.0–52.0)
Hemoglobin: 15.4 g/dL (ref 13.0–17.0)
Lymphocytes Relative: 18.9 % (ref 12.0–46.0)
Lymphs Abs: 1.9 K/uL (ref 0.7–4.0)
MCHC: 34 g/dL (ref 30.0–36.0)
MCV: 87.7 fl (ref 78.0–100.0)
Monocytes Absolute: 0.6 K/uL (ref 0.1–1.0)
Monocytes Relative: 6.5 % (ref 3.0–12.0)
Neutro Abs: 7 K/uL (ref 1.4–7.7)
Neutrophils Relative %: 70 % (ref 43.0–77.0)
Platelets: 258 K/uL (ref 150.0–400.0)
RBC: 5.16 Mil/uL (ref 4.22–5.81)
RDW: 12.8 % (ref 11.5–15.5)
WBC: 9.9 K/uL (ref 4.0–10.5)

## 2023-08-31 LAB — COMPREHENSIVE METABOLIC PANEL WITH GFR
ALT: 30 U/L (ref 0–53)
AST: 21 U/L (ref 0–37)
Albumin: 4.8 g/dL (ref 3.5–5.2)
Alkaline Phosphatase: 51 U/L (ref 39–117)
BUN: 12 mg/dL (ref 6–23)
CO2: 26 meq/L (ref 19–32)
Calcium: 9.8 mg/dL (ref 8.4–10.5)
Chloride: 102 meq/L (ref 96–112)
Creatinine, Ser: 1.12 mg/dL (ref 0.40–1.50)
GFR: 71.88 mL/min (ref >60.00)
Glucose, Bld: 135 mg/dL — ABNORMAL HIGH (ref 70–99)
Potassium: 4.2 meq/L (ref 3.5–5.1)
Sodium: 138 meq/L (ref 135–145)
Total Bilirubin: 0.7 mg/dL (ref 0.2–1.2)
Total Protein: 7.1 g/dL (ref 6.0–8.3)

## 2023-08-31 LAB — POCT URINALYSIS DIPSTICK
Bilirubin, UA: NEGATIVE
Glucose, UA: NEGATIVE
Ketones, UA: NEGATIVE
Leukocytes, UA: NEGATIVE
Nitrite, UA: NEGATIVE
Protein, UA: POSITIVE — AB
Spec Grav, UA: 1.02 (ref 1.010–1.025)
Urobilinogen, UA: 0.2 U/dL
pH, UA: 6 (ref 5.0–8.0)

## 2023-08-31 LAB — BRAIN NATRIURETIC PEPTIDE: Pro B Natriuretic peptide (BNP): 22 pg/mL (ref 0.0–100.0)

## 2023-08-31 LAB — TSH: TSH: 1.25 u[IU]/mL (ref 0.35–5.50)

## 2023-08-31 LAB — CK: Total CK: 89 U/L (ref 17–232)

## 2023-08-31 LAB — PSA: PSA: 0.96 ng/mL (ref 0.10–4.00)

## 2023-08-31 LAB — VITAMIN B12: Vitamin B-12: 874 pg/mL (ref 211–911)

## 2023-08-31 LAB — MAGNESIUM: Magnesium: 2 mg/dL (ref 1.5–2.5)

## 2023-08-31 LAB — VITAMIN D 25 HYDROXY (VIT D DEFICIENCY, FRACTURES): VITD: 25.58 ng/mL — ABNORMAL LOW (ref 30.00–100.00)

## 2023-08-31 MED ORDER — ATORVASTATIN CALCIUM 40 MG PO TABS: 40.0000 mg | ORAL_TABLET | Freq: Every day | ORAL | 3 refills | Status: DC

## 2023-08-31 NOTE — Patient Instructions (Signed)
 Medication Instructions:  STOP Crestor  for 1 week and monitor symptoms  CONTINUE Brilinta  and Aspirin  START Lipitor 40mg  Take 1 tablet once a day *If you need a refill on your cardiac medications before your next appointment, please call your pharmacy*  Lab Work: None ordered If you have labs (blood work) drawn today and your tests are completely normal, you will receive your results only by: MyChart Message (if you have MyChart) OR A paper copy in the mail If you have any lab test that is abnormal or we need to change your treatment, we will call you to review the results.  Testing/Procedures: None ordered   Follow-Up: At [facility], you and your health needs are our priority.  As part of our continuing mission to provide you with exceptional heart care, our providers are all part of one team.  This team includes your primary Cardiologist (physician) and Advanced Practice Providers or APPs (Physician Assistants and Nurse Practitioners) who all work together to provide you with the care you need, when you need it.  Your next appointment:   1-2 week(s) (AROUND 09/12/23)  Provider:   ANY AVAILABLE APP    We recommend signing up for the patient portal called MyChart.  Sign up information is provided on this After Visit Summary.  MyChart is used to connect with patients for Virtual Visits (Telemedicine).  Patients are able to view lab/test results, encounter notes, upcoming appointments, etc.  Non-urgent messages can be sent to your provider as well.   To learn more about what you can do with MyChart, go to [email or web address].   Other Instructions You have been referred to PHARM-D and CARDIAC REHAB

## 2023-08-31 NOTE — Assessment & Plan Note (Signed)
 Chronic, stable. Continue cialis  5mg  daily.
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
## 2023-08-31 NOTE — Patient Instructions (Addendum)
 It was great to see you!  We are checking your labs today and will let you know the results via mychart/phone.   Keep appointment with cardiology  Keep drinking water and a gatorade during the day   I have placed referral to GI for colonscopy  Let's follow-up in 6 months, sooner if you have concerns.  If a referral was placed today, you will be contacted for an appointment. Please note that routine referrals can sometimes take up to 3-4 weeks to process. Please call our office if you haven't heard anything after this time frame.  Take care,  Tinnie Harada, NP'

## 2023-09-01 ENCOUNTER — Telehealth: Payer: Self-pay | Admitting: Cardiovascular Disease

## 2023-09-01 DIAGNOSIS — R5383 Other fatigue: Secondary | ICD-10-CM | POA: Insufficient documentation

## 2023-09-01 DIAGNOSIS — I252 Old myocardial infarction: Secondary | ICD-10-CM | POA: Insufficient documentation

## 2023-09-01 LAB — URINE CULTURE
MICRO NUMBER:: 16735755
Result:: NO GROWTH
SPECIMEN QUALITY:: ADEQUATE

## 2023-09-01 NOTE — Assessment & Plan Note (Signed)
 He experienced a myocardial infarction two weeks ago and continues to have fatigue, dyspnea, and leg pain. Muscle aches prompted a discussion on statins versus Zetia for cholesterol management. He was advised against returning to work prematurely and the importance of cardiac rehabilitation was emphasized. Continue Brilinta  90mg  BID and aspirin  81mg  daily. Discuss statin versus Zetia with a cardiologist. Encourage participation in a cardiac rehabilitation program. Monitor blood pressure regularly. Check labs including CBC, CMP, thyroid, and BNP. Refer to a cardiologist for further management.

## 2023-09-01 NOTE — Telephone Encounter (Signed)
 Caller (Abby) stated she is following up on patient's updated FMLA paperwork.  Caller stated they were having issues with their fax last week and can fax paperwork to fax# [phone] or alternate [phone].

## 2023-09-01 NOTE — Assessment & Plan Note (Signed)
 His significant fatigue is likely related to the myocardial infarction, inadequate sleep, and caregiving stress. Emphasize rest and self-care. Check labs including CBC, CMP, thyroid, vitamin D , B12. Encourage adequate rest and self-care.

## 2023-09-01 NOTE — Assessment & Plan Note (Signed)
Health maintenance reviewed and updated. Discussed nutrition, exercise. Follow-up 1 year.

## 2023-09-01 NOTE — Telephone Encounter (Signed)
 Will forward this message to Angie Duke PA-C and covering CMA, for further management.

## 2023-09-01 NOTE — Assessment & Plan Note (Signed)
 After his MI, he was started on rosuvastatin  20mg  daily. He has been experiencing muscle cramping. Will check CK today. He is going to discuss this with cardiology this afternoon.

## 2023-09-01 NOTE — Assessment & Plan Note (Signed)
 Chronic, stable. He declines medication and is limiting his salt. Continue checking blood pressure at home.

## 2023-09-02 NOTE — Telephone Encounter (Signed)
 Spoke with pt. Return to work note has been extended to September 16, 2023 per pts request. Pt is aware that I will check with Dr. Santa nurse to see if FMLA paperwork was faxed or available to refax Monday 09/05/23. Letter was printed out for pt to pick up Monday 09/05/23. Pt will notify me when he arrives to pick up letter which will be located in [place] mailbox.

## 2023-09-02 NOTE — Telephone Encounter (Signed)
 Pt is calling to check on his FLMA papers because Employer is requesting them back asap and return to work note was written to return 8/6 but it needs to be 8/8 and with no restrictions. Please advise

## 2023-09-02 NOTE — Telephone Encounter (Signed)
 Patient is following up requesting updates on FMLA paperwork. He would like a call back to discuss where it is in process today if possible. She is worried because it's a time sensitive matter and employer is requesting ASAP. See previous encounter(s), please advise.

## 2023-09-05 ENCOUNTER — Telehealth (HOSPITAL_COMMUNITY): Payer: Self-pay

## 2023-09-05 NOTE — Telephone Encounter (Signed)
 Spoke with pt. Letter is ready for pickup on the 5th floor.

## 2023-09-05 NOTE — Telephone Encounter (Signed)
 Patient called back regarding cardiac rehab, is unsure he can attend due to starting work next month. Patient stated he will check with [facility] in [place] to see if their program works better with his schedule. F/u in one week on 7/01.

## 2023-09-05 NOTE — Telephone Encounter (Signed)
 Pt calling back to see if forms are available to pick up

## 2023-09-06 NOTE — Telephone Encounter (Signed)
 Completed, signed FMLA form faxed to [employer] [facility] [employer] [phone].  Will place to be picked up and scanned into chart.

## 2023-09-07 NOTE — Telephone Encounter (Signed)
 Called and left VM for patient to come in to fill out [employer]< billing sheet, and pay $29 payment in the form of cash, check, or money order.

## 2023-09-08 ENCOUNTER — Ambulatory Visit (INDEPENDENT_AMBULATORY_CARE_PROVIDER_SITE_OTHER): Admitting: Urology

## 2023-09-08 ENCOUNTER — Encounter: Payer: Self-pay | Admitting: Urology

## 2023-09-08 VITALS — BP 145/79 | HR 75 | Ht 75.0 in | Wt 259.0 lb

## 2023-09-08 DIAGNOSIS — N200 Calculus of kidney: Secondary | ICD-10-CM | POA: Diagnosis not present

## 2023-09-08 DIAGNOSIS — Z905 Acquired absence of kidney: Secondary | ICD-10-CM | POA: Diagnosis not present

## 2023-09-08 DIAGNOSIS — R310 Gross hematuria: Secondary | ICD-10-CM | POA: Insufficient documentation

## 2023-09-08 LAB — URINALYSIS, ROUTINE W REFLEX MICROSCOPIC
Bilirubin, UA: NEGATIVE
Glucose, UA: NEGATIVE
Ketones, UA: NEGATIVE
Nitrite, UA: NEGATIVE
Specific Gravity, UA: 1.02 (ref 1.005–1.030)
Urobilinogen, Ur: 0.2 mg/dL (ref 0.2–1.0)
pH, UA: 6 (ref 5.0–7.5)

## 2023-09-08 LAB — MICROSCOPIC EXAMINATION: RBC, Urine: >30 /HPF — ABNORMAL HIGH (ref 0–2)

## 2023-09-08 NOTE — Telephone Encounter (Signed)
 Spoke with pt and new letter with Jon Hails PA's signature was left for pickup on the 5th floor near coumadin clinic.
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
## 2023-09-09 ENCOUNTER — Encounter (INDEPENDENT_AMBULATORY_CARE_PROVIDER_SITE_OTHER): Payer: Self-pay | Admitting: Nurse Practitioner

## 2023-09-09 ENCOUNTER — Telehealth: Payer: Self-pay | Admitting: Cardiovascular Disease

## 2023-09-09 DIAGNOSIS — F5101 Primary insomnia: Secondary | ICD-10-CM

## 2023-09-09 NOTE — Telephone Encounter (Signed)
 Spoke to patient he stated he is taking Brilinta  he wanted to know if ok to take [employer].Advised he should not take a Goody powder.Advised No Nsaids.

## 2023-09-09 NOTE — Telephone Encounter (Signed)
 Pt would like a c/b regarding what OTC meds he is able to take being that he is on BRILINTA . Pt says that he is having trouble sleeping at night. Please advise.

## 2023-09-12 ENCOUNTER — Telehealth (HOSPITAL_COMMUNITY): Payer: Self-pay

## 2023-09-12 DIAGNOSIS — F5101 Primary insomnia: Secondary | ICD-10-CM | POA: Diagnosis not present

## 2023-09-12 NOTE — Telephone Encounter (Signed)
 Patient called back and confirmed he wants to continue cardiac rehab through our location and not [place]. Informed patient we are waiting on his Medicaid form to be returned from Dr. Santa office and once we have that we can verify his insurance and get him scheduled. Patient states he is only able to attend the 1:45 class, informed patient he may be on a wait until September depending on where the schedule is when we get his Medicaid form back. Patient acknowledged understand.  Resent Medicaid form to Dr. Santa office.

## 2023-09-13 MED ORDER — TRAZODONE HCL 50 MG PO TABS: 25.0000 mg | ORAL_TABLET | Freq: Every evening | ORAL | 1 refills | Status: DC | PRN

## 2023-09-13 NOTE — Addendum Note (Signed)
 Addended by: Quin Mcpherson A on: 09/13/2023 09:12 AM   Modules accepted: Orders

## 2023-09-13 NOTE — Telephone Encounter (Signed)

## 2023-09-14 ENCOUNTER — Encounter: Admitting: Nurse Practitioner

## 2023-09-14 ENCOUNTER — Telehealth: Payer: Self-pay | Admitting: Cardiovascular Disease

## 2023-09-14 MED ORDER — ESZOPICLONE 1 MG PO TABS: 1.0000 mg | ORAL_TABLET | Freq: Every evening | ORAL | 0 refills | Status: DC | PRN

## 2023-09-14 NOTE — Telephone Encounter (Signed)
 Spoke with patient who said that when he comes in for his OV with Tessa on 08/12, he will sign the release of information for his form that was already sent to his employer, [employer].

## 2023-09-14 NOTE — Addendum Note (Signed)
 Addended by: Odessa Nishi A on: 09/14/2023 04:26 PM   Modules accepted: Orders

## 2023-09-15 ENCOUNTER — Other Ambulatory Visit (HOSPITAL_COMMUNITY): Payer: Self-pay

## 2023-09-15 ENCOUNTER — Encounter: Payer: Self-pay | Admitting: Cardiovascular Disease
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
## 2023-09-20 ENCOUNTER — Encounter: Payer: Self-pay | Admitting: Physician Assistant

## 2023-09-20 ENCOUNTER — Ambulatory Visit: Attending: Physician Assistant | Admitting: Physician Assistant

## 2023-09-20 VITALS — BP 122/80 | HR 76 | Ht 75.5 in | Wt 267.0 lb

## 2023-09-20 DIAGNOSIS — Z789 Other specified health status: Secondary | ICD-10-CM | POA: Diagnosis not present

## 2023-09-20 DIAGNOSIS — Z9861 Coronary angioplasty status: Secondary | ICD-10-CM | POA: Diagnosis not present

## 2023-09-20 DIAGNOSIS — R42 Dizziness and giddiness: Secondary | ICD-10-CM | POA: Diagnosis present

## 2023-09-20 DIAGNOSIS — I2119 ST elevation (STEMI) myocardial infarction involving other coronary artery of inferior wall: Secondary | ICD-10-CM

## 2023-09-20 DIAGNOSIS — I2510 Atherosclerotic heart disease of native coronary artery without angina pectoris: Secondary | ICD-10-CM | POA: Diagnosis not present

## 2023-09-20 DIAGNOSIS — E785 Hyperlipidemia, unspecified: Secondary | ICD-10-CM

## 2023-09-20 DIAGNOSIS — R55 Syncope and collapse: Secondary | ICD-10-CM | POA: Diagnosis present

## 2023-09-20 DIAGNOSIS — Z419 Encounter for procedure for purposes other than remedying health state, unspecified: Secondary | ICD-10-CM | POA: Diagnosis not present

## 2023-09-20 NOTE — Patient Instructions (Signed)
 Medication Instructions:  Your physician recommends that you continue on your current medications as directed. Please refer to the Current Medication list given to you today.  *If you need a refill on your cardiac medications before your next appointment, please call your pharmacy*  Lab Work: TO BE DONE IN 3 MONTHS: LIPIDS  If you have labs (blood work) drawn today and your tests are completely normal, you will receive your results only by: MyChart Message (if you have MyChart) OR A paper copy in the mail If you have any lab test that is abnormal or we need to change your treatment, we will call you to review the results.  Testing/Procedures: NONE  Follow-Up: At [facility], you and your health needs are our priority.  As part of our continuing mission to provide you with exceptional heart care, our providers are all part of one team.  This team includes your primary Cardiologist (physician) and Advanced Practice Providers or APPs (Physician Assistants and Nurse Practitioners) who all work together to provide you with the care you need, when you need it.  Your next appointment:   3 month(s)  Provider:   DR. FRANCYNE  We recommend signing up for the patient portal called MyChart.  Sign up information is provided on this After Visit Summary.  MyChart is used to connect with patients for Virtual Visits (Telemedicine).  Patients are able to view lab/test results, encounter notes, upcoming appointments, etc.  Non-urgent messages can be sent to your provider as well.   To learn more about what you can do with MyChart, go to [email or web address].   Other Instructions PLEASE LIMITED THE ENERGY DRINKS

## 2023-09-21 ENCOUNTER — Encounter: Payer: Self-pay | Admitting: Urology

## 2023-09-23 NOTE — Progress Notes (Signed)
No objections

## 2023-09-25 ENCOUNTER — Encounter: Payer: Self-pay | Admitting: Orthopaedic Surgery

## 2023-09-26 ENCOUNTER — Telehealth: Payer: Self-pay | Admitting: Urology

## 2023-09-26 NOTE — Telephone Encounter (Signed)
 Lvm for patient to schedule CT follow up

## 2023-09-26 NOTE — Telephone Encounter (Signed)
-----   Message from [place] [facility] sent at 09/26/2023  9:07 AM EDT ----- He has been scheduled for his CT scan for the 19th Alyson can you get him scheduled for his cysto please.  Thanks,  Rosaline ----- Message ----- From: Roseann Adine PARAS., MD Sent: 09/22/2023  12:47 PM EDT To: Tammy M Bell; Alyson Jasiak  This patient was supposed to be scheduled for a CT hematuria study followed by cystoscopy.  Could you please check on the status of the CT hematuria study and get this scheduled as soon as possible so that we can get him in for his cystoscopy visit?

## 2023-09-27 ENCOUNTER — Ambulatory Visit (HOSPITAL_BASED_OUTPATIENT_CLINIC_OR_DEPARTMENT_OTHER)
Admission: RE | Admit: 2023-09-27 | Discharge: 2023-09-27 | Disposition: A | Discharge status: Home / Self Care | Source: Ambulatory Visit | Attending: Urology | Admitting: Urology

## 2023-09-27 DIAGNOSIS — R310 Gross hematuria: Secondary | ICD-10-CM | POA: Insufficient documentation

## 2023-09-27 DIAGNOSIS — N200 Calculus of kidney: Secondary | ICD-10-CM | POA: Insufficient documentation

## 2023-09-27 MED ORDER — IOHEXOL 300 MG/ML  SOLN
125.0000 mL | Freq: Once | INTRAMUSCULAR | Status: AC | PRN
  Administered 2023-09-27: 125 mL via INTRAVENOUS

## 2023-09-28 ENCOUNTER — Telehealth (HOSPITAL_COMMUNITY): Payer: Self-pay

## 2023-09-28 ENCOUNTER — Telehealth: Payer: Self-pay | Admitting: Cardiovascular Disease

## 2023-09-28 NOTE — Telephone Encounter (Signed)
 Hailey from [facility] is calling as she has faxed over Medicare form twice to be signed by Dr. Verlin and she has not heard back.

## 2023-09-28 NOTE — Telephone Encounter (Signed)
 Resent Medicaid form to Dr. Santa office for 3rd time. Called [facility] office, asked them to leave a message for Dr. Santa staff to have him fill out the form and send it back so we can get patient scheduled.

## 2023-09-29 NOTE — Telephone Encounter (Signed)
 Form completed and faxed today

## 2023-09-30 ENCOUNTER — Other Ambulatory Visit: Payer: Self-pay

## 2023-09-30 ENCOUNTER — Emergency Department (HOSPITAL_BASED_OUTPATIENT_CLINIC_OR_DEPARTMENT_OTHER)

## 2023-09-30 ENCOUNTER — Emergency Department (HOSPITAL_BASED_OUTPATIENT_CLINIC_OR_DEPARTMENT_OTHER): Admitting: Radiology

## 2023-09-30 ENCOUNTER — Emergency Department (HOSPITAL_BASED_OUTPATIENT_CLINIC_OR_DEPARTMENT_OTHER)
Admission: EM | Admit: 2023-09-30 | Discharge: 2023-09-30 | Disposition: A | Discharge status: Home / Self Care | Attending: Emergency Medicine | Admitting: Emergency Medicine

## 2023-09-30 DIAGNOSIS — F17290 Nicotine dependence, other tobacco product, uncomplicated: Secondary | ICD-10-CM | POA: Diagnosis not present

## 2023-09-30 DIAGNOSIS — I10 Essential (primary) hypertension: Secondary | ICD-10-CM | POA: Insufficient documentation

## 2023-09-30 DIAGNOSIS — Z7982 Long term (current) use of aspirin: Secondary | ICD-10-CM | POA: Diagnosis not present

## 2023-09-30 DIAGNOSIS — R079 Chest pain, unspecified: Secondary | ICD-10-CM | POA: Diagnosis present

## 2023-09-30 DIAGNOSIS — Z91040 Latex allergy status: Secondary | ICD-10-CM | POA: Diagnosis not present

## 2023-09-30 LAB — URINALYSIS, ROUTINE W REFLEX MICROSCOPIC
Bacteria, UA: NONE SEEN
Bilirubin Urine: NEGATIVE
Glucose, UA: NEGATIVE mg/dL
Ketones, ur: NEGATIVE mg/dL
Leukocytes,Ua: NEGATIVE
Nitrite: NEGATIVE
RBC / HPF: >50 RBC/hpf (ref 0–5)
Specific Gravity, Urine: >1.046 — ABNORMAL HIGH (ref 1.005–1.030)
pH: 6 (ref 5.0–8.0)

## 2023-09-30 LAB — CBC
HCT: 43.1 % (ref 39.0–52.0)
Hemoglobin: 14.9 g/dL (ref 13.0–17.0)
MCH: 30.7 pg (ref 26.0–34.0)
MCHC: 34.6 g/dL (ref 30.0–36.0)
MCV: 88.9 fL (ref 80.0–100.0)
Platelets: 258 K/uL (ref 150–400)
RBC: 4.85 MIL/uL (ref 4.22–5.81)
RDW: 12.7 % (ref 11.5–15.5)
WBC: 9.6 K/uL (ref 4.0–10.5)
nRBC: 0 % (ref 0.0–0.2)

## 2023-09-30 LAB — TROPONIN T, HIGH SENSITIVITY: Troponin T High Sensitivity: <15 ng/L (ref 0–19)

## 2023-09-30 LAB — BASIC METABOLIC PANEL WITH GFR
Anion gap: 10 (ref 5–15)
BUN: 15 mg/dL (ref 6–20)
CO2: 16 mmol/L — ABNORMAL LOW (ref 22–32)
Calcium: 7 mg/dL — ABNORMAL LOW (ref 8.9–10.3)
Chloride: 112 mmol/L — ABNORMAL HIGH (ref 98–111)
Creatinine, Ser: 0.86 mg/dL (ref 0.61–1.24)
GFR, Estimated: >60 mL/min (ref >60)
Glucose, Bld: 76 mg/dL (ref 70–99)
Potassium: 3.5 mmol/L (ref 3.5–5.1)
Sodium: 138 mmol/L (ref 135–145)

## 2023-09-30 MED ORDER — ONDANSETRON HCL 4 MG/2ML IJ SOLN
4.0000 mg | Freq: Once | INTRAMUSCULAR | Status: AC
  Administered 2023-09-30: 4 mg via INTRAVENOUS
  Filled 2023-09-30: qty 2

## 2023-09-30 MED ORDER — IOHEXOL 350 MG/ML SOLN
100.0000 mL | Freq: Once | INTRAVENOUS | Status: AC | PRN
  Administered 2023-09-30: 100 mL via INTRAVENOUS

## 2023-09-30 NOTE — ED Notes (Signed)
 Pt reports back pain yesterday, CP radiating to back today starting ~ 1130
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
## 2023-09-30 NOTE — ED Triage Notes (Signed)
 Left CP sudden onset. Into back. Hx MI July.

## 2023-09-30 NOTE — Discharge Instructions (Addendum)
 While you were in the emergency room, you had blood work done that overall was normal.  Look for signs of injury to your heart, problems with your aorta or in the lungs.  At this time, I do not have a clear cause for your symptoms.  Continue taking all medications as prescribed.  Please call your primary care doctor and your cardiologist to establish follow-up appointment.  Return to the emergency room if you develop worsening pain in your chest, or difficulty with your breathing.

## 2023-10-02 ENCOUNTER — Encounter: Payer: Self-pay | Admitting: Nurse Practitioner

## 2023-10-02 NOTE — ED Notes (Signed)
 Patient called in requesting note for Last Friday through next Wednesday.  Spoke with nurse he can call his primary or Cardiologist on Monday if he feels he needs additonal time.   Advised we could give a note for Friday.

## 2023-10-03 ENCOUNTER — Telehealth (HOSPITAL_COMMUNITY): Payer: Self-pay

## 2023-10-03 ENCOUNTER — Telehealth: Payer: Self-pay

## 2023-10-03 ENCOUNTER — Ambulatory Visit: Payer: Self-pay | Admitting: Urology

## 2023-10-03 ENCOUNTER — Telehealth: Payer: Self-pay | Admitting: Urology

## 2023-10-03 NOTE — Telephone Encounter (Signed)
 Copied from CRM #[id number]. Topic: Appointments - Appointment Scheduling >> Oct 03, 2023 10:54 AM Leon Pena wrote: Patient/patient representative is calling to schedule an appointment. Refer to attachments for appointment information. Patient was seen at the ED on Friday 09/30/2023 due to chest pain, he wanted a return to work letter, offered patient an appointment but he declined stated he couldn't keep missing work for appointments. I advised we were unable to write the letter due to not having seen or treat the patient which is why an appointment was required. He still refused.

## 2023-10-03 NOTE — Telephone Encounter (Signed)
LMOM asking pt to return call.  

## 2023-10-03 NOTE — Telephone Encounter (Signed)
 spoke with Pt schdule appointment for wednesday at 4:00pm

## 2023-10-03 NOTE — Telephone Encounter (Signed)
 Pt insurance is active and benefits verified through [facility]. Co-pay $0, DED $1,250/$0 met, out of pocket $4,890/$0 met, co-insurance 20%. No pre-authorization required. 10/03/2023 @ 9:33am, spoke with Nica, REF# [id number].  TCR/ICR? ICR Visit(date of service)limitation? No Can multiple codes be used on the same date of service/visit?(IF ITS A LIMIT) Yes  Is this a lifetime maximum or an annual maximum? Annual Has the member used any of these services to date? No Is there a time limit (weeks/months) on start of program and/or program completion? No  *Patient has Medicaid [employer] listed, the [place] was not added to his chart until after we submitted Medicaid form to his doctor. Confirmed with Medicaid [employer] that his plan is still active with them until 04/07/2024. Spoke with Jannette ORN. On 10/03/2023 at 9:45am, [id number]#[id number]. Patient would be eligible for TCR 36 sessions and $4 copay.

## 2023-10-03 NOTE — Telephone Encounter (Signed)
 Patient returned call, he is no longer interested in cardiac rehab. Informed patient if he changes his mind he can call us  back to reopen it.  Closing referral.

## 2023-10-03 NOTE — Telephone Encounter (Signed)
 Attempted to call patient to go over insurance and possibly schedule cardiac rehab- no answer, left message. Sent MyChart message.

## 2023-10-03 NOTE — Telephone Encounter (Addendum)
 Didn't have any cancellations PT was scheduled for  10/05/23 at 4:00.

## 2023-10-03 NOTE — Telephone Encounter (Signed)
 Wants the Cysto procedure explained to him when someone has time.

## 2023-10-03 NOTE — Telephone Encounter (Signed)
 Spoke w pt regarding cystoscopy. All of pt's questions answered.

## 2023-10-03 NOTE — Telephone Encounter (Signed)
 Copied from CRM [id number]. Topic: General - Call Back - No Documentation >> Oct 03, 2023  1:36 PM Rea C wrote: Reason for CRM: Patient returned call and stated that he would stand by phone if there is a cancellation for today.

## 2023-10-04 ENCOUNTER — Ambulatory Visit: Admitting: Nurse Practitioner

## 2023-10-04 ENCOUNTER — Ambulatory Visit: Admitting: Orthopaedic Surgery

## 2023-10-05 ENCOUNTER — Ambulatory Visit: Admitting: Nurse Practitioner

## 2023-10-05 NOTE — Telephone Encounter (Unsigned)
 Copied from CRM #[id number]. Topic: Appointments - Appointment Cancel/Reschedule >> Oct 04, 2023  3:39 PM Rosina BIRCH wrote: Patient/patient representative is calling to cancel or reschedule an appointment. Refer to attachments for appointment information.   Patient called stating he did not want to move up his appointment because he just got a new job and does not want to miss any more work. Patient stated he does not know if he need the appointment because he is feeling ok. Patient stated he made it just in case he needed the appointment

## 2023-10-05 NOTE — Telephone Encounter (Signed)
 Form faxed to [facility] and scanned into chart. Payment not received and release of information not signed prior to faxing.

## 2023-10-05 NOTE — Telephone Encounter (Signed)
 Noted

## 2023-10-10 ENCOUNTER — Emergency Department (HOSPITAL_BASED_OUTPATIENT_CLINIC_OR_DEPARTMENT_OTHER)
Admission: EM | Admit: 2023-10-10 | Discharge: 2023-10-10 | Disposition: A | Discharge status: Home / Self Care | Attending: Emergency Medicine | Admitting: Emergency Medicine

## 2023-10-10 ENCOUNTER — Other Ambulatory Visit: Payer: Self-pay

## 2023-10-10 ENCOUNTER — Encounter: Payer: Self-pay | Admitting: Physician Assistant

## 2023-10-10 ENCOUNTER — Emergency Department (HOSPITAL_BASED_OUTPATIENT_CLINIC_OR_DEPARTMENT_OTHER)

## 2023-10-10 ENCOUNTER — Encounter (HOSPITAL_BASED_OUTPATIENT_CLINIC_OR_DEPARTMENT_OTHER): Payer: Self-pay

## 2023-10-10 DIAGNOSIS — Z7982 Long term (current) use of aspirin: Secondary | ICD-10-CM | POA: Insufficient documentation

## 2023-10-10 DIAGNOSIS — I10 Essential (primary) hypertension: Secondary | ICD-10-CM | POA: Diagnosis not present

## 2023-10-10 DIAGNOSIS — Z87442 Personal history of urinary calculi: Secondary | ICD-10-CM | POA: Insufficient documentation

## 2023-10-10 DIAGNOSIS — R42 Dizziness and giddiness: Secondary | ICD-10-CM | POA: Diagnosis present

## 2023-10-10 DIAGNOSIS — H81399 Other peripheral vertigo, unspecified ear: Secondary | ICD-10-CM | POA: Diagnosis not present

## 2023-10-10 DIAGNOSIS — Z91040 Latex allergy status: Secondary | ICD-10-CM | POA: Insufficient documentation

## 2023-10-10 LAB — MAGNESIUM: Magnesium: 2 mg/dL (ref 1.7–2.4)

## 2023-10-10 LAB — COMPREHENSIVE METABOLIC PANEL WITH GFR
ALT: 28 U/L (ref 0–44)
AST: 25 U/L (ref 15–41)
Albumin: 4.4 g/dL (ref 3.5–5.0)
Alkaline Phosphatase: 61 U/L (ref 38–126)
Anion gap: 12 (ref 5–15)
BUN: 13 mg/dL (ref 6–20)
CO2: 22 mmol/L (ref 22–32)
Calcium: 9.2 mg/dL (ref 8.9–10.3)
Chloride: 103 mmol/L (ref 98–111)
Creatinine, Ser: 1.14 mg/dL (ref 0.61–1.24)
GFR, Estimated: >60 mL/min (ref >60)
Glucose, Bld: 142 mg/dL — ABNORMAL HIGH (ref 70–99)
Potassium: 4.4 mmol/L (ref 3.5–5.1)
Sodium: 137 mmol/L (ref 135–145)
Total Bilirubin: 0.5 mg/dL (ref 0.0–1.2)
Total Protein: 6.7 g/dL (ref 6.5–8.1)

## 2023-10-10 LAB — CBC WITH DIFFERENTIAL/PLATELET
Abs Immature Granulocytes: 0.04 K/uL (ref 0.00–0.07)
Basophils Absolute: 0.1 K/uL (ref 0.0–0.1)
Basophils Relative: 1 %
Eosinophils Absolute: 0.2 K/uL (ref 0.0–0.5)
Eosinophils Relative: 2 %
HCT: 41.6 % (ref 39.0–52.0)
Hemoglobin: 14.4 g/dL (ref 13.0–17.0)
Immature Granulocytes: 1 %
Lymphocytes Relative: 16 %
Lymphs Abs: 1.4 K/uL (ref 0.7–4.0)
MCH: 30.4 pg (ref 26.0–34.0)
MCHC: 34.6 g/dL (ref 30.0–36.0)
MCV: 87.9 fL (ref 80.0–100.0)
Monocytes Absolute: 0.6 K/uL (ref 0.1–1.0)
Monocytes Relative: 7 %
Neutro Abs: 6.2 K/uL (ref 1.7–7.7)
Neutrophils Relative %: 73 %
Platelets: 264 K/uL (ref 150–400)
RBC: 4.73 MIL/uL (ref 4.22–5.81)
RDW: 12.7 % (ref 11.5–15.5)
WBC: 8.5 K/uL (ref 4.0–10.5)
nRBC: 0 % (ref 0.0–0.2)

## 2023-10-10 LAB — RESP PANEL BY RT-PCR (RSV, FLU A&B, COVID)  RVPGX2
Influenza A by PCR: NEGATIVE
Influenza B by PCR: NEGATIVE
Resp Syncytial Virus by PCR: NEGATIVE
SARS Coronavirus 2 by RT PCR: NEGATIVE

## 2023-10-10 LAB — PRO BRAIN NATRIURETIC PEPTIDE: Pro Brain Natriuretic Peptide: <50 pg/mL (ref <300.0)

## 2023-10-10 LAB — TROPONIN T, HIGH SENSITIVITY: Troponin T High Sensitivity: <15 ng/L (ref 0–19)

## 2023-10-10 MED ORDER — MECLIZINE HCL 25 MG PO TABS
25.0000 mg | ORAL_TABLET | Freq: Once | ORAL | Status: AC
  Administered 2023-10-10: 25 mg via ORAL
  Filled 2023-10-10: qty 1

## 2023-10-10 MED ORDER — MECLIZINE HCL 25 MG PO TABS: 25.0000 mg | ORAL_TABLET | Freq: Three times a day (TID) | ORAL | 0 refills | Status: DC | PRN

## 2023-10-10 MED ORDER — SODIUM CHLORIDE 0.9 % IV BOLUS
1000.0000 mL | Freq: Once | INTRAVENOUS | Status: AC
  Administered 2023-10-10: 1000 mL via INTRAVENOUS

## 2023-10-10 MED ORDER — IOHEXOL 350 MG/ML SOLN
75.0000 mL | Freq: Once | INTRAVENOUS | Status: AC | PRN
  Administered 2023-10-10: 75 mL via INTRAVENOUS

## 2023-10-10 MED ORDER — DIAZEPAM 5 MG PO TABS: 5.0000 mg | ORAL_TABLET | Freq: Once | ORAL | Status: DC

## 2023-10-10 NOTE — ED Notes (Signed)
 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 The pt has been having dizzy spells since starting his statin but is also concerned it's stress related.

## 2023-10-10 NOTE — ED Triage Notes (Signed)
 Pt to er, pt states that July he had a heart attach and since then they have put him on a statin and it makes him feel a little bit dizzy, states that last night he was bathing his mother and he started seeing stars, got really sweaty and almost passed out.  Pt states that he is here for continued dizziness.

## 2023-10-10 NOTE — Discharge Instructions (Addendum)
 It was a pleasure caring for you today in the emergency department.  Your symptoms today are likely secondary to peripheral vertigo which is a dysfunction of your inner ear which can provoke dizziness symptoms.  I started you on a medication called meclizine  which can help treat the symptoms.  Recommend you move from a seated to a standing position very carefully if you feel dizzy then please sit back down.  Please follow-up with ENT for evaluation for vestibular rehab, further treatment of vertigo if symptoms persist.  Please follow-up with primary team regarding questions regarding statin.  Please return to the emergency department for any worsening or worrisome symptoms.
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
## 2023-10-10 NOTE — ED Notes (Signed)
 Pt ambulated to the bathroom without assistance or incident. PT advised dizziness has improved from 10/10 down to 3/10 now. Pt is just concerned for being dizzy on a ladder for work and wants to make sure he's cleared to function safely.

## 2023-10-10 NOTE — ED Notes (Signed)
 Pt said he feels like shit because he had a near syncopal episode this morning after cleaning his family member. The exertion was not abnormal and he attributes it to starting to take a Statin for cholesterol since it's made him feel bad as well. No weakness noted, but pt advised he was drenched in sweat and the room was spinning, with visual hallucinations and stars.

## 2023-10-11 NOTE — Telephone Encounter (Signed)
 Called patient about message. Patient stated he went to ED, and his has vertigo and does not need to see anyone at this time.

## 2023-10-13 ENCOUNTER — Ambulatory Visit: Admitting: Pharmacist

## 2023-10-13 ENCOUNTER — Ambulatory Visit (INDEPENDENT_AMBULATORY_CARE_PROVIDER_SITE_OTHER): Admitting: Urology

## 2023-10-13 ENCOUNTER — Encounter: Payer: Self-pay | Admitting: Urology

## 2023-10-13 VITALS — BP 144/78 | HR 67 | Ht 76.0 in | Wt 258.0 lb

## 2023-10-13 DIAGNOSIS — N529 Male erectile dysfunction, unspecified: Secondary | ICD-10-CM | POA: Diagnosis not present

## 2023-10-13 DIAGNOSIS — N200 Calculus of kidney: Secondary | ICD-10-CM | POA: Diagnosis not present

## 2023-10-13 DIAGNOSIS — R310 Gross hematuria: Secondary | ICD-10-CM | POA: Diagnosis not present

## 2023-10-13 DIAGNOSIS — Z905 Acquired absence of kidney: Secondary | ICD-10-CM | POA: Diagnosis not present

## 2023-10-13 LAB — URINALYSIS, ROUTINE W REFLEX MICROSCOPIC
Bilirubin, UA: NEGATIVE
Glucose, UA: NEGATIVE
Ketones, UA: NEGATIVE
Nitrite, UA: NEGATIVE
Specific Gravity, UA: 1.02 (ref 1.005–1.030)
Urobilinogen, Ur: 0.2 mg/dL (ref 0.2–1.0)
pH, UA: 6 (ref 5.0–7.5)

## 2023-10-13 LAB — MICROSCOPIC EXAMINATION
Mucus, UA: POSITIVE — AB
RBC, Urine: >30 /HPF — AB (ref 0–2)
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
## 2023-10-14 ENCOUNTER — Encounter: Payer: Self-pay | Admitting: Urology

## 2023-10-14 ENCOUNTER — Other Ambulatory Visit: Payer: Self-pay | Admitting: Urology

## 2023-10-14 DIAGNOSIS — N529 Male erectile dysfunction, unspecified: Secondary | ICD-10-CM

## 2023-10-14 MED ORDER — SILDENAFIL CITRATE 100 MG PO TABS
100.0000 mg | ORAL_TABLET | Freq: Every day | ORAL | 11 refills | Status: AC | PRN
Start: 2023-10-14 — End: ?

## 2023-10-14 MED ORDER — CIPROFLOXACIN HCL 500 MG PO TABS
500.0000 mg | ORAL_TABLET | Freq: Once | ORAL | Status: AC
  Administered 2023-10-14: 500 mg via ORAL

## 2023-10-14 NOTE — Addendum Note (Signed)
 Addended by: OBADIAH ROSELEE RAMAN on: 10/14/2023 10:17 AM   Modules accepted: Orders

## 2023-10-18 ENCOUNTER — Ambulatory Visit: Admitting: Orthopaedic Surgery

## 2023-10-19 ENCOUNTER — Encounter: Payer: Self-pay | Admitting: Urology

## 2023-10-19 ENCOUNTER — Ambulatory Visit: Admitting: Nurse Practitioner

## 2023-10-19 LAB — UROV MD:CYTOLOGY W/REFLEX FISH ATYPICAL CYTOLOGY

## 2023-10-20 ENCOUNTER — Ambulatory Visit: Payer: Self-pay | Admitting: Urology

## 2023-10-21 DIAGNOSIS — Z419 Encounter for procedure for purposes other than remedying health state, unspecified: Secondary | ICD-10-CM | POA: Diagnosis not present

## 2023-11-12 ENCOUNTER — Other Ambulatory Visit: Payer: Self-pay | Admitting: Urology

## 2023-11-12 ENCOUNTER — Encounter: Payer: Self-pay | Admitting: Urology

## 2023-11-12 DIAGNOSIS — Z905 Acquired absence of kidney: Secondary | ICD-10-CM

## 2023-11-12 DIAGNOSIS — N130 Hydronephrosis with ureteropelvic junction obstruction: Secondary | ICD-10-CM

## 2023-11-12 DIAGNOSIS — N200 Calculus of kidney: Secondary | ICD-10-CM

## 2023-11-15 ENCOUNTER — Ambulatory Visit: Admitting: Orthopaedic Surgery

## 2023-11-16 ENCOUNTER — Telehealth: Payer: Self-pay | Admitting: Urology

## 2023-11-16 NOTE — Telephone Encounter (Signed)
 Patient called concerned about still having blood in his urine. He states that he is still drinking lots pf water but is not seeing much improvement. Pt wants to know when he should be concerned. Please advise.

## 2023-11-17 NOTE — Telephone Encounter (Signed)
LMOM requested a return call.

## 2023-11-17 NOTE — Telephone Encounter (Signed)
 Pt called and asked if you could call him around 10 am tomorrow.

## 2023-11-21 NOTE — Telephone Encounter (Signed)
 Spoke with pt in reference gross hematuria. Pt states that his urine was initially bright red. Pt states that he increased his fluid intake and now his urine is tea colored. Pt just wanted to make you aware prior to appt this week.

## 2023-11-21 NOTE — Telephone Encounter (Signed)
 Leon Pena

## 2023-11-24 ENCOUNTER — Encounter: Payer: Self-pay | Admitting: Nurse Practitioner

## 2023-11-24 ENCOUNTER — Ambulatory Visit (INDEPENDENT_AMBULATORY_CARE_PROVIDER_SITE_OTHER): Admitting: Nurse Practitioner

## 2023-11-24 VITALS — BP 126/68 | HR 75 | Temp 97.8°F | Ht 76.0 in | Wt 269.2 lb

## 2023-11-24 DIAGNOSIS — R7301 Impaired fasting glucose: Secondary | ICD-10-CM

## 2023-11-24 DIAGNOSIS — E785 Hyperlipidemia, unspecified: Secondary | ICD-10-CM

## 2023-11-24 DIAGNOSIS — R42 Dizziness and giddiness: Secondary | ICD-10-CM

## 2023-11-24 DIAGNOSIS — N200 Calculus of kidney: Secondary | ICD-10-CM | POA: Diagnosis not present

## 2023-11-24 DIAGNOSIS — F5101 Primary insomnia: Secondary | ICD-10-CM

## 2023-11-24 DIAGNOSIS — I10 Essential (primary) hypertension: Secondary | ICD-10-CM

## 2023-11-24 DIAGNOSIS — I252 Old myocardial infarction: Secondary | ICD-10-CM

## 2023-11-24 NOTE — Progress Notes (Signed)
 Established Patient Office Visit  Subjective   Patient ID: Leon Luecke., male    DOB: [birth year]  Age: 58 y.o. MRN: [id number]  Chief Complaint  Patient presents with   Insomnia    Unable to sleep well    HPI Discussed the use of AI scribe software for clinical note transcription with the patient, who gave verbal consent to proceed.  History of Present Illness   Leon Pena is a 58 year old male with coronary artery disease who presents with ongoing dizziness and sleep disturbances.  He experienced a heart attack in July and has been managing his health since then. Dizziness is ongoing but not a major issue currently. He suspects it may be related to his glasses. Meclizine  was tried for vertigo but discontinued due to side effects. There is no chest pain or shortness of breath.  He has significant sleep disturbances, with difficulty falling and staying asleep. His routine of caring for his mother, requiring early waking, contributes to this. Various sleep aids, including herbal supplements, have been tried with some improvement. He currently sleeps about five hours per night.  He has multiple kidney stones, with ten stones lodged in his kidney causing urine backup and tissue issues. Hematuria occurred over the past weekend, and increased water intake has helped somewhat. Physical activity exacerbates the bleeding.  He is prediabetic and monitors his blood sugar regularly. Recent levels are in the 110s, and he is actively managing his diet. He reports a significant change in vision over the past year, discussed with his eye doctor.  He is taking Brilinta  and a statin, which he believes contribute to his fatigue. He has adjusted his diet to reduce fried foods and sodium to manage cholesterol without medication.       ROS See pertinent positives and negatives per HPI.    Objective:     BP 126/68 (BP Location: Left Arm, Patient Position: Sitting, Cuff Size: Large)    Pulse 75   Temp 97.8 F (36.6 C)   Ht 6' 4 (1.93 m)   Wt 269 lb 3.2 oz (122.1 kg)   SpO2 95%   BMI 32.77 kg/m    Physical Exam Vitals and nursing note reviewed.  Constitutional:      Appearance: Normal appearance.  HENT:     Head: Normocephalic.  Eyes:     Conjunctiva/sclera: Conjunctivae normal.  Cardiovascular:     Rate and Rhythm: Normal rate and regular rhythm.     Pulses: Normal pulses.     Heart sounds: Normal heart sounds.  Pulmonary:     Effort: Pulmonary effort is normal.     Breath sounds: Normal breath sounds.  Musculoskeletal:     Cervical back: Normal range of motion.  Skin:    General: Skin is warm.  Neurological:     General: No focal deficit present.     Mental Status: He is alert and oriented to person, place, and time.  Psychiatric:        Mood and Affect: Mood normal.        Behavior: Behavior normal.        Thought Content: Thought content normal.        Judgment: Judgment normal.      Assessment & Plan:   Problem List Items Addressed This Visit       Cardiovascular and Mediastinum   HTN (hypertension) - Primary   Chronic, stable. Continue checking blood pressure at home, limiting salt and  getting regular exercise.         Genitourinary   Nephrolithiasis   Chronic kidney stones are causing hydronephrosis and hematuria. Surgery is delayed due to Brilinta  use. He should continue increased hydration as advised by the urologist and follow up with [facility] for potential robotic surgery once cleared from Brilinta .        Other   Primary insomnia   Chronic insomnia is possibly improved with an herbal supplement, as previous medications were not tolerated. Continue the current herbal supplement for sleep.      Hyperlipidemia   Chronic, stable. Hyperlipidemia is managed with atorvastatin  40mg  daily, and he is making dietary changes to control cholesterol. Continue statin therapy. Order a lipid panel today and send results to the  cardiologist.      Relevant Orders   Comprehensive metabolic panel with GFR (Completed)   Lipid panel (Completed)   Vertigo   Symptoms of benign paroxysmal positional vertigo are not currently problematic and may be related to glasses use. Monitor symptoms and reassess if vertigo becomes problematic.       Other Visit Diagnoses       IFG (impaired fasting glucose)       Continue monitoring blood glucose levels and encourage continued lifestyle modifications to prevent diabetes. Check A1c today.   Relevant Orders   Hemoglobin A1c (Completed)       Return in about 6 months (around 05/24/2024) for prediabetes, sleep.    Tinnie DELENA Harada, NP

## 2023-11-24 NOTE — Patient Instructions (Signed)
 It was great to see you!  You can keep taking the supplement for sleep   Keep drinking plenty of fluids  Let's follow-up in 6 months, sooner if you have concerns.  If a referral was placed today, you will be contacted for an appointment. Please note that routine referrals can sometimes take up to 3-4 weeks to process. Please call our office if you haven't heard anything after this time frame.  Take care,  Tinnie Harada, NP

## 2023-11-25 ENCOUNTER — Ambulatory Visit: Payer: Self-pay | Admitting: Nurse Practitioner

## 2023-11-25 LAB — COMPREHENSIVE METABOLIC PANEL WITH GFR
ALT: 28 U/L (ref 0–53)
AST: 28 U/L (ref 0–37)
Albumin: 4.6 g/dL (ref 3.5–5.2)
Alkaline Phosphatase: 52 U/L (ref 39–117)
BUN: 14 mg/dL (ref 6–23)
CO2: 23 meq/L (ref 19–32)
Calcium: 9.2 mg/dL (ref 8.4–10.5)
Chloride: 103 meq/L (ref 96–112)
Creatinine, Ser: 1.16 mg/dL (ref 0.40–1.50)
GFR: 68.81 mL/min (ref >60.00)
Glucose, Bld: 119 mg/dL — ABNORMAL HIGH (ref 70–99)
Potassium: 3.7 meq/L (ref 3.5–5.1)
Sodium: 136 meq/L (ref 135–145)
Total Bilirubin: 0.6 mg/dL (ref 0.2–1.2)
Total Protein: 7 g/dL (ref 6.0–8.3)

## 2023-11-25 LAB — LIPID PANEL
Cholesterol: 106 mg/dL (ref 0–200)
HDL: 49.6 mg/dL (ref >39.00)
LDL Cholesterol: 40 mg/dL (ref 0–99)
NonHDL: 56.89
Total CHOL/HDL Ratio: 2
Triglycerides: 86 mg/dL (ref 0.0–149.0)
VLDL: 17.2 mg/dL (ref 0.0–40.0)

## 2023-11-25 LAB — HEMOGLOBIN A1C: Hgb A1c MFr Bld: 6.1 % (ref 4.6–6.5)

## 2023-11-26 DIAGNOSIS — R42 Dizziness and giddiness: Secondary | ICD-10-CM | POA: Insufficient documentation

## 2023-11-26 NOTE — Assessment & Plan Note (Signed)
 Chronic kidney stones are causing hydronephrosis and hematuria. Surgery is delayed due to Brilinta  use. He should continue increased hydration as advised by the urologist and follow up with [facility] for potential robotic surgery once cleared from Brilinta .

## 2023-11-26 NOTE — Assessment & Plan Note (Signed)
 Chronic insomnia is possibly improved with an herbal supplement, as previous medications were not tolerated. Continue the current herbal supplement for sleep.

## 2023-11-26 NOTE — Assessment & Plan Note (Signed)
 Symptoms of benign paroxysmal positional vertigo are not currently problematic and may be related to glasses use. Monitor symptoms and reassess if vertigo becomes problematic.

## 2023-11-26 NOTE — Assessment & Plan Note (Signed)
 Chronic, stable. Continue checking blood pressure at home, limiting salt and getting regular exercise.

## 2023-11-26 NOTE — Assessment & Plan Note (Signed)
 Chronic, stable. Hyperlipidemia is managed with atorvastatin  40mg  daily, and he is making dietary changes to control cholesterol. Continue statin therapy. Order a lipid panel today and send results to the cardiologist.

## 2023-12-01 ENCOUNTER — Ambulatory Visit: Admitting: Urology

## 2023-12-01 ENCOUNTER — Encounter: Payer: Self-pay | Admitting: Urology

## 2023-12-01 VITALS — BP 122/78 | HR 76 | Ht 76.0 in | Wt 256.0 lb

## 2023-12-01 DIAGNOSIS — R310 Gross hematuria: Secondary | ICD-10-CM

## 2023-12-01 DIAGNOSIS — N200 Calculus of kidney: Secondary | ICD-10-CM

## 2023-12-01 DIAGNOSIS — N130 Hydronephrosis with ureteropelvic junction obstruction: Secondary | ICD-10-CM | POA: Diagnosis not present

## 2023-12-01 DIAGNOSIS — Z905 Acquired absence of kidney: Secondary | ICD-10-CM | POA: Diagnosis not present

## 2023-12-01 LAB — URINALYSIS, ROUTINE W REFLEX MICROSCOPIC
Bilirubin, UA: NEGATIVE
Glucose, UA: NEGATIVE
Nitrite, UA: NEGATIVE
Specific Gravity, UA: 1.02 (ref 1.005–1.030)
Urobilinogen, Ur: 0.2 mg/dL (ref 0.2–1.0)
pH, UA: 5.5 (ref 5.0–7.5)

## 2023-12-01 LAB — MICROSCOPIC EXAMINATION: RBC, Urine: >30 /HPF — AB (ref 0–2)
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
## 2023-12-05 ENCOUNTER — Encounter: Payer: Self-pay | Admitting: Nurse Practitioner

## 2023-12-12 ENCOUNTER — Encounter: Payer: Self-pay | Admitting: Radiology

## 2023-12-16 ENCOUNTER — Ambulatory Visit: Admitting: Urology

## 2023-12-20 ENCOUNTER — Ambulatory Visit: Admitting: Orthopaedic Surgery

## 2023-12-21 DIAGNOSIS — Z419 Encounter for procedure for purposes other than remedying health state, unspecified: Secondary | ICD-10-CM | POA: Diagnosis not present

## 2023-12-23 ENCOUNTER — Ambulatory Visit: Attending: Cardiovascular Disease | Admitting: Cardiovascular Disease

## 2023-12-23 ENCOUNTER — Encounter: Payer: Self-pay | Admitting: Cardiovascular Disease

## 2023-12-23 VITALS — BP 118/62 | HR 65 | Ht 76.0 in | Wt 270.7 lb

## 2023-12-23 DIAGNOSIS — N200 Calculus of kidney: Secondary | ICD-10-CM

## 2023-12-23 DIAGNOSIS — R7303 Prediabetes: Secondary | ICD-10-CM | POA: Diagnosis not present

## 2023-12-23 DIAGNOSIS — E785 Hyperlipidemia, unspecified: Secondary | ICD-10-CM

## 2023-12-23 DIAGNOSIS — Q6239 Other obstructive defects of renal pelvis and ureter: Secondary | ICD-10-CM

## 2023-12-23 DIAGNOSIS — I2510 Atherosclerotic heart disease of native coronary artery without angina pectoris: Secondary | ICD-10-CM

## 2023-12-23 DIAGNOSIS — E669 Obesity, unspecified: Secondary | ICD-10-CM

## 2023-12-23 MED ORDER — REPATHA SURECLICK 140 MG/ML ~~LOC~~ SOAJ: 140.0000 mg | SUBCUTANEOUS | 3 refills | Status: DC
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
## 2023-12-23 NOTE — Patient Instructions (Signed)
 Medication Instructions:  Stop Atorvastatin  Start Repatha 140 mg subcutaneous injection every 14 days *If you need a refill on your cardiac medications before your next appointment, please call your pharmacy*  Lab Work: Lipid panel- 3 months= Feb 2026 No eating or drinking after midnight- water is ok. You can have this drawn at any [facility]- or come to our office at [address] [place], [place] [address], lab is on the 1st floor. No appointment needed- drop in from 8-4. The lab order is in the computer, a lab slip is not needed. Thank you Katie, RN  If you have labs (blood work) drawn today and your tests are completely normal, you will receive your results only by: MyChart Message (if you have MyChart) OR A paper copy in the mail If you have any lab test that is abnormal or we need to change your treatment, we will call you to review the results.  Testing/Procedures: None ordered  Follow-Up: At [facility], you and your health needs are our priority.  As part of our continuing mission to provide you with exceptional heart care, our providers are all part of one team.  This team includes your primary Cardiologist (physician) and Advanced Practice Providers or APPs (Physician Assistants and Nurse Practitioners) who all work together to provide you with the care you need, when you need it.  Your next appointment:   6 month(s)  Provider:   Dr Francyne  We recommend signing up for the patient portal called MyChart.  Sign up information is provided on this After Visit Summary.  MyChart is used to connect with patients for Virtual Visits (Telemedicine).  Patients are able to view lab/test results, encounter notes, upcoming appointments, etc.  Non-urgent messages can be sent to your provider as well.   To learn more about what you can do with MyChart, go to [email or web address].

## 2023-12-26 ENCOUNTER — Other Ambulatory Visit (HOSPITAL_COMMUNITY): Payer: Self-pay

## 2023-12-26 ENCOUNTER — Telehealth: Payer: Self-pay

## 2023-12-26 DIAGNOSIS — E785 Hyperlipidemia, unspecified: Secondary | ICD-10-CM

## 2023-12-26 NOTE — Telephone Encounter (Signed)
-----   Message from Nurse Comer F sent at 12/23/2023  5:27 PM EST ----- Regarding: Needs PA for Repatha Needs PA for Repatha

## 2023-12-28 NOTE — Telephone Encounter (Signed)
 Pharmacy Patient Advocate Encounter  Received notification from [facility] that Prior Authorization for REPATHA  has been DENIED.  Full denial letter will be uploaded to the media tab. See denial reason below.  DENIED DUE TO LDL BEING UNDER 55 MG/DL

## 2023-12-29 NOTE — Telephone Encounter (Signed)
 Pt called to f/u on PA for Repatha please advise

## 2023-12-30 NOTE — Addendum Note (Signed)
 Addended by: Dusten Ellinwood L on: 12/30/2023 05:41 PM   Modules accepted: Orders

## 2023-12-30 NOTE — Telephone Encounter (Signed)
 Tobie Robbi POUR, [facility] to Me (Selected Message)     12/29/23  2:47 PM Please place referral for lipid clinic.   Referral placed.

## 2024-01-03 ENCOUNTER — Other Ambulatory Visit (HOSPITAL_COMMUNITY): Payer: Self-pay

## 2024-01-03 ENCOUNTER — Telehealth: Payer: Self-pay | Admitting: Cardiovascular Disease

## 2024-01-03 NOTE — Telephone Encounter (Signed)
 We will try to get the Repatha  approved. Meanwhile, let's try rosuvastatin  20 mg daily instead. He may tolerate that better than the Repatha . If he has muscle aches with rosuvastatin  as well, he should definitely get coverage for the Repatha .

## 2024-01-03 NOTE — Telephone Encounter (Signed)
*  STAT* If patient is at the pharmacy, call can be transferred to refill team.   1. Which medications need to be refilled? (please list name of each medication and dose if known) Atorvastatin  40mg    2. Would you like to learn more about the convenience, safety, & potential cost savings by using the [facility]? no     3. Are you open to using the [facility] no   4. Which pharmacy/location (including street and city if local pharmacy) is medication to be sent to? [facility] [address] [place], [place] - [address] AT [facility] [address]    5. Do they need a 30 day or 90 day supply? 30  Patient's repatha  was denied and needs refill on his statin

## 2024-01-03 NOTE — Telephone Encounter (Signed)
 Dr. Francyne,  He has tried Rosuvastatin  in the past, according to the chart, and had to come off due to myalgia's back in July.    Please advise!

## 2024-01-03 NOTE — Telephone Encounter (Signed)
 Patient called stating he needs prior auth for repatha .  He said it needs numbers and why he needs the medication.

## 2024-01-03 NOTE — Telephone Encounter (Signed)
 Pt has been made aware that we will work on the appeal of the Repatha  and someone will keep in contact with him when we hear back.  He was appreciative of the call back.

## 2024-01-03 NOTE — Telephone Encounter (Signed)
 Will appeal the Repatha  denial.  Spoke to our pharmacy team Fonda) a few minutes ago

## 2024-01-03 NOTE — Telephone Encounter (Signed)
 Patient called in to follow up on Repatha  PA. I advised him it was denied and he was being referred to the lipid clinic. Patient is not sure why he needs to come in to see the Pharmacist, he's not sure why he can't just be told what to do over the phone. He says the PA was denied because we did not put in the correct information Please advise.

## 2024-01-04 NOTE — Telephone Encounter (Addendum)
 PA denial reason per chart media is due to LDL being below 55 mg/dL (normal) and has nothing to do with pt's indication of use. Please reference the denial letter on chart media to see what diagnosis code was submitted on PA request (I21.19). Plan considers an LDL below 55 mg/dL normal for ASCVD. Criteria for Repatha  approval for pt's with ASCVD on this plan requires an LDL above 55 mg/dL. Patient has a current LDL of 40 mg/dL as of 89/83/74. Request will require appeal for approval. RPH has been advised.

## 2024-01-04 NOTE — Telephone Encounter (Signed)
 Appeal letter received via email. Appeal information has been faxed to plan.

## 2024-01-10 ENCOUNTER — Encounter: Payer: Self-pay | Admitting: Nurse Practitioner

## 2024-01-10 ENCOUNTER — Ambulatory Visit: Admitting: Nurse Practitioner

## 2024-01-10 VITALS — BP 118/70 | HR 71 | Temp 98.0°F | Ht 76.0 in | Wt 270.6 lb

## 2024-01-10 DIAGNOSIS — I252 Old myocardial infarction: Secondary | ICD-10-CM

## 2024-01-10 DIAGNOSIS — R252 Cramp and spasm: Secondary | ICD-10-CM | POA: Insufficient documentation

## 2024-01-10 DIAGNOSIS — Z1211 Encounter for screening for malignant neoplasm of colon: Secondary | ICD-10-CM

## 2024-01-10 DIAGNOSIS — R040 Epistaxis: Secondary | ICD-10-CM

## 2024-01-10 DIAGNOSIS — E785 Hyperlipidemia, unspecified: Secondary | ICD-10-CM

## 2024-01-10 DIAGNOSIS — R42 Dizziness and giddiness: Secondary | ICD-10-CM

## 2024-01-10 LAB — CBC WITH DIFFERENTIAL/PLATELET
Basophils Absolute: 0.1 K/uL (ref 0.0–0.1)
Basophils Relative: 0.9 % (ref 0.0–3.0)
Eosinophils Absolute: 0.5 K/uL (ref 0.0–0.7)
Eosinophils Relative: 6.1 % — ABNORMAL HIGH (ref 0.0–5.0)
HCT: 43.7 % (ref 39.0–52.0)
Hemoglobin: 14.8 g/dL (ref 13.0–17.0)
Lymphocytes Relative: 18.5 % (ref 12.0–46.0)
Lymphs Abs: 1.6 K/uL (ref 0.7–4.0)
MCHC: 33.9 g/dL (ref 30.0–36.0)
MCV: 88.6 fl (ref 78.0–100.0)
Monocytes Absolute: 0.6 K/uL (ref 0.1–1.0)
Monocytes Relative: 6.9 % (ref 3.0–12.0)
Neutro Abs: 5.7 K/uL (ref 1.4–7.7)
Neutrophils Relative %: 67.6 % (ref 43.0–77.0)
Platelets: 271 K/uL (ref 150.0–400.0)
RBC: 4.94 Mil/uL (ref 4.22–5.81)
RDW: 12.8 % (ref 11.5–15.5)
WBC: 8.5 K/uL (ref 4.0–10.5)

## 2024-01-10 LAB — COMPREHENSIVE METABOLIC PANEL WITH GFR
ALT: 23 U/L (ref 0–53)
AST: 20 U/L (ref 0–37)
Albumin: 4.6 g/dL (ref 3.5–5.2)
Alkaline Phosphatase: 47 U/L (ref 39–117)
BUN: 15 mg/dL (ref 6–23)
CO2: 25 meq/L (ref 19–32)
Calcium: 9.6 mg/dL (ref 8.4–10.5)
Chloride: 104 meq/L (ref 96–112)
Creatinine, Ser: 0.98 mg/dL (ref 0.40–1.50)
GFR: 84.16 mL/min (ref >60.00)
Glucose, Bld: 153 mg/dL — ABNORMAL HIGH (ref 70–99)
Potassium: 4.2 meq/L (ref 3.5–5.1)
Sodium: 135 meq/L (ref 135–145)
Total Bilirubin: 0.5 mg/dL (ref 0.2–1.2)
Total Protein: 6.9 g/dL (ref 6.0–8.3)

## 2024-01-10 LAB — LIPID PANEL
Cholesterol: 142 mg/dL (ref 0–200)
HDL: 53.4 mg/dL (ref >39.00)
LDL Cholesterol: 62 mg/dL (ref 0–99)
NonHDL: 89.09
Total CHOL/HDL Ratio: 3
Triglycerides: 137 mg/dL (ref 0.0–149.0)
VLDL: 27.4 mg/dL (ref 0.0–40.0)

## 2024-01-10 LAB — TSH: TSH: 1.5 u[IU]/mL (ref 0.35–5.50)

## 2024-01-10 LAB — MAGNESIUM: Magnesium: 2 mg/dL (ref 1.5–2.5)

## 2024-01-10 MED ORDER — SCOPOLAMINE 1 MG/3DAYS TD PT72: 1.0000 | MEDICATED_PATCH | TRANSDERMAL | 12 refills | Status: AC

## 2024-01-10 MED ORDER — REPATHA SURECLICK 140 MG/ML ~~LOC~~ SOAJ: 140.0000 mg | SUBCUTANEOUS | 3 refills | Status: AC

## 2024-01-10 NOTE — Progress Notes (Signed)
 Acute Office Visit  Subjective:     Patient ID: Leon Pena., male    DOB: 1965/02/18, 58 y.o.   MRN: [id number]  Chief Complaint  Patient presents with   Fatigue    With dizziness for 1 month, concerns with nose bleeds since Friday    HPI Discussed the use of AI scribe software for clinical note transcription with the patient, who gave verbal consent to proceed.  History of Present Illness   Leon Pena. Ron is a 58 year old male with coronary artery disease who presents with worsening dizziness and fatigue.  He reports dizziness worsening over the past seven to eight months, described as feeling woozy while sitting or walking, lasting from ten minutes to three hours. Symptoms started before he stopped statins. He has not started Repatha  due to insurance issues. He denies chest pain but has shortness of breath with activity.  Since Friday he has had recurrent epistaxis with episodes lasting up to an hour while taking Brilinta  and aspirin . He notes worsening leg cramps similar to those before his heart attack and is taking magnesium  and a multivitamin with testosterone.  He drinks about 82 ounces of water daily and uses electrolyte powders for hydration.      ROS See pertinent positives and negatives per HPI.     Objective:    BP 118/70 (BP Location: Left Arm, Cuff Size: Large)   Pulse 71   Temp 98 F (36.7 C)   Ht 6' 4 (1.93 m)   Wt 270 lb 9.6 oz (122.7 kg)   SpO2 96%   BMI 32.94 kg/m    Physical Exam Vitals and nursing note reviewed.  Constitutional:      Appearance: Normal appearance.  HENT:     Head: Normocephalic.     Right Ear: Tympanic membrane, ear canal and external ear normal.     Left Ear: Tympanic membrane, ear canal and external ear normal.  Eyes:     Extraocular Movements: Extraocular movements intact.     Conjunctiva/sclera: Conjunctivae normal.     Pupils: Pupils are equal, round, and reactive to light.  Cardiovascular:     Rate  and Rhythm: Normal rate and regular rhythm.     Pulses: Normal pulses.     Heart sounds: Normal heart sounds.  Pulmonary:     Effort: Pulmonary effort is normal.     Breath sounds: Normal breath sounds.  Musculoskeletal:     Cervical back: Normal range of motion and neck supple. No tenderness.  Lymphadenopathy:     Cervical: No cervical adenopathy.  Skin:    General: Skin is warm.  Neurological:     General: No focal deficit present.     Mental Status: He is alert and oriented to person, place, and time.     Comments: Dix-hallpike maneuver positive bilaterally  Psychiatric:        Mood and Affect: Mood normal.        Behavior: Behavior normal.        Thought Content: Thought content normal.        Judgment: Judgment normal.      Assessment & Plan:   Problem List Items Addressed This Visit       Other   Hyperlipidemia   Intolerance to statins due to muscle aches. He has tried atorvastatin  and rosuvastatin  with side effects to both. With history of MI, will see if we can order repatha  140mg  every 2 weeks.  Check lipid  panel today.      Relevant Medications   Evolocumab  (REPATHA  SURECLICK) 140 MG/ML SOAJ   Other Relevant Orders   Lipid panel   History of MI (myocardial infarction)   Continues on Brilinta  and aspirin  for secondary prevention. No recent chest pain or significant shortness of breath. He has not tolerated rosuvatatin and atorvastatin . Will order repatha  140mg  every 2 weeks.       Relevant Medications   Evolocumab  (REPATHA  SURECLICK) 140 MG/ML SOAJ   Other Relevant Orders   Lipid panel   Vertigo - Primary   Chronic vertigo worsens with head movements. Meclizine  is not tolerated. Referred to vestibular rehabilitation for maneuvers to realign otoliths. Prescribed scopolamine patch for dizziness. Advised use of non-drowsy over-the-counter antihistamines if needed.      Relevant Orders   Ambulatory referral to Physical Therapy   TSH   Muscle cramping   Cramps  are worsening. Previous statin use is associated with muscle aches. Ordered blood work to check CMP and magnesium  levels. Advised use of electrolyte supplements along with increased water.      Relevant Orders   Comprehensive metabolic panel with GFR   CBC with Differential/Platelet   TSH   Magnesium    Epistaxis   Intermittent nosebleeds possibly related to dry air. Start nasal saline spray 2-3 times per day.       Other Visit Diagnoses       Screen for colon cancer       Referral placed to GI   Relevant Orders   Ambulatory referral to Gastroenterology      Meds ordered this encounter  Medications   scopolamine (TRANSDERM-SCOP) 1 MG/3DAYS    Sig: Place 1 patch (1 mg total) onto the skin every 3 (three) days.    Dispense:  10 patch    Refill:  12   Evolocumab  (REPATHA  SURECLICK) 140 MG/ML SOAJ    Sig: Inject 140 mg into the skin every 14 (fourteen) days.    Dispense:  6 mL    Refill:  3    Return if symptoms worsen or fail to improve.  Tinnie DELENA Harada, NP

## 2024-01-10 NOTE — Assessment & Plan Note (Signed)
 Continues on Brilinta  and aspirin  for secondary prevention. No recent chest pain or significant shortness of breath. He has not tolerated rosuvatatin and atorvastatin . Will order repatha  140mg  every 2 weeks.

## 2024-01-10 NOTE — Assessment & Plan Note (Signed)
 Cramps are worsening. Previous statin use is associated with muscle aches. Ordered blood work to check CMP and magnesium  levels. Advised use of electrolyte supplements along with increased water.

## 2024-01-10 NOTE — Patient Instructions (Signed)
 It was great to see you!  Keep drinking plenty of water and add in electrolytes daily   I have placed a referral to vestibular rehab, they will call to schedule   Start scopolamine patch every 3 days as needed for dizziness. You can also try non drowsy dramamine over the counter  Let's follow-up based on results   Take care,  Tinnie Harada, NP

## 2024-01-10 NOTE — Assessment & Plan Note (Signed)
 Chronic vertigo worsens with head movements. Meclizine  is not tolerated. Referred to vestibular rehabilitation for maneuvers to realign otoliths. Prescribed scopolamine patch for dizziness. Advised use of non-drowsy over-the-counter antihistamines if needed.

## 2024-01-10 NOTE — Assessment & Plan Note (Signed)
 Intolerance to statins due to muscle aches. He has tried atorvastatin  and rosuvastatin  with side effects to both. With history of MI, will see if we can order repatha  140mg  every 2 weeks.  Check lipid panel today.

## 2024-01-10 NOTE — Assessment & Plan Note (Signed)
 Intermittent nosebleeds possibly related to dry air. Start nasal saline spray 2-3 times per day.

## 2024-01-11 ENCOUNTER — Ambulatory Visit: Admitting: Nurse Practitioner

## 2024-01-11 ENCOUNTER — Ambulatory Visit: Payer: Self-pay | Admitting: Nurse Practitioner

## 2024-01-12 ENCOUNTER — Other Ambulatory Visit (HOSPITAL_COMMUNITY): Payer: Self-pay

## 2024-01-12 NOTE — Telephone Encounter (Signed)
 Call patient to inform about Repatha  approval, N/A LVM

## 2024-01-12 NOTE — Telephone Encounter (Signed)
 Test claim is now going through for a $0 copay.

## 2024-01-26 ENCOUNTER — Ambulatory Visit: Admitting: Family Medicine

## 2024-01-26 ENCOUNTER — Encounter: Payer: Self-pay | Admitting: Internal Medicine

## 2024-01-26 ENCOUNTER — Ambulatory Visit: Admitting: Internal Medicine

## 2024-01-26 ENCOUNTER — Telehealth: Payer: Self-pay

## 2024-01-26 VITALS — BP 120/82 | HR 62 | Temp 98.1°F | Ht 76.0 in | Wt 268.8 lb

## 2024-01-26 DIAGNOSIS — R051 Acute cough: Secondary | ICD-10-CM

## 2024-01-26 NOTE — Telephone Encounter (Signed)
 Copied from CRM #[id number]. Topic: Appointments - Appointment Info/Confirmation >> Jan 26, 2024 12:17 PM Rea ORN wrote: Pt asking if he will be tested for RSV at today's appt. Please call back to advise, [phone]

## 2024-01-26 NOTE — Patient Instructions (Addendum)
 Rest, drink plenty of fluids.  Tylenol  for fever, body aches.   For cough: Take Mucinex DM over-the-counter.  Follow the instructions in the box.  For sore throat:  Use over-the-counter throat lozenges as needed  Use over-the-counter chloraseptic spray as needed  Warm salt water gargles  Tylenol : take as directed on packaging.   For nasal and sinus congestion: Use over-the-counter Flonase: 2 nasal sprays on each side of the nose in the morning until you feel better  Patients with high blood pressure can use Coricidin HBP for decongestant, it will not raise your blood pressure.     If you have been prescribed an antibiotic, take as prescribed.  Call if not gradually better over the next  10 days  Call anytime if the symptoms are severe, you have high fever, short of breath, chest pain
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
## 2024-01-27 ENCOUNTER — Telehealth: Payer: Self-pay

## 2024-01-27 NOTE — Telephone Encounter (Signed)
 Test was collected , awaiting results.

## 2024-01-27 NOTE — Telephone Encounter (Signed)
 Copied from CRM #[id number]. Topic: Clinical - Lab/Test Results >> Jan 27, 2024  3:49 PM Pinkey ORN wrote: Reason for CRM: Lab Results >> Jan 27, 2024  3:50 PM Pinkey ORN wrote: Patient is requesting to receive an office call once the results are in.

## 2024-01-29 ENCOUNTER — Encounter: Payer: Self-pay | Admitting: Internal Medicine

## 2024-01-30 ENCOUNTER — Ambulatory Visit

## 2024-01-30 NOTE — Telephone Encounter (Signed)
 I called and spoke with patient and notified him that we will call once results come in.

## 2024-01-31 ENCOUNTER — Ambulatory Visit: Admitting: Orthopaedic Surgery

## 2024-01-31 ENCOUNTER — Ambulatory Visit: Payer: Self-pay | Admitting: Internal Medicine

## 2024-01-31 LAB — COVID-19, FLU A+B AND RSV
Influenza A, NAA: NOT DETECTED
Influenza B, NAA: NOT DETECTED
RSV, NAA: DETECTED — AB
SARS-CoV-2, NAA: NOT DETECTED

## 2024-02-03 ENCOUNTER — Ambulatory Visit: Attending: Nurse Practitioner | Admitting: Physical Therapy
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
## 2024-02-13 ENCOUNTER — Encounter: Payer: Self-pay | Admitting: Cardiovascular Disease

## 2024-02-13 ENCOUNTER — Telehealth: Payer: Self-pay | Admitting: Cardiovascular Disease

## 2024-02-13 NOTE — Telephone Encounter (Signed)
 Let's just wait and see what his blood pressure is when he is in clinic and more relaxed.

## 2024-02-13 NOTE — Telephone Encounter (Signed)
 See phone note

## 2024-02-13 NOTE — Telephone Encounter (Signed)
 Patient also sent my chart message. Message reviewed. Patient reports BP has been elevated in the 160-170/100 range for the last month.  Prior to this he had only been checking periodically and BP was at his baseline.  Patient has been visiting his mother often in the hospital and has missed a few doses of Brilinta .  Diagnosed with RSV in December.  Took Coricidin but has not taken for about a week.  Not taking any decongestants.  Is improving from RSV but still has lingering cough and nasal congestion. No recent increased salt intake.  BP today was 172/100 and yesterday was 186/100.  He has been using wrist monitor.  Has upper arm BP cuff and will start using this.  Has been having shortness of breath the last week.  Constant blurry vision for about a month.  No headache, one sided weakness, slurred speech or facial drooping.  Patient has been scheduled to see Miriam Shams, NP on 1/7.  I told him we would contact him if Dr Francyne wanted to make any changes prior to this appointment ED precautions reviewed with patient

## 2024-02-13 NOTE — Telephone Encounter (Signed)
 Pt c/o BP issue: STAT if pt c/o blurred vision, one-sided weakness or slurred speech.  STAT if BP is GREATER than 180/120 TODAY.  STAT if BP is LESS than 90/60 and SYMPTOMATIC TODAY  1. What is your BP concern? Hypertension   2. Have you taken any BP medication today?no   3. What are your last 5 BP readings?179/100   4. Are you having any other symptoms (ex. Dizziness, headache, blurred vision, passed out)? Blurry vision    Pt has been having high blood pressure readings for the past week. He is very concerned and wants to speak with a nurse. Pt scheduled next available 1/7. Please advise.
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
## 2024-02-15 ENCOUNTER — Encounter: Payer: Self-pay | Admitting: Emergency Medicine

## 2024-02-15 ENCOUNTER — Ambulatory Visit: Attending: Emergency Medicine | Admitting: Emergency Medicine

## 2024-02-15 VITALS — BP 124/76 | HR 71 | Ht 76.0 in | Wt 262.0 lb

## 2024-02-15 DIAGNOSIS — Z905 Acquired absence of kidney: Secondary | ICD-10-CM

## 2024-02-15 DIAGNOSIS — R310 Gross hematuria: Secondary | ICD-10-CM

## 2024-02-15 DIAGNOSIS — R0609 Other forms of dyspnea: Secondary | ICD-10-CM

## 2024-02-15 DIAGNOSIS — I2510 Atherosclerotic heart disease of native coronary artery without angina pectoris: Secondary | ICD-10-CM

## 2024-02-15 DIAGNOSIS — E785 Hyperlipidemia, unspecified: Secondary | ICD-10-CM | POA: Diagnosis not present

## 2024-02-15 DIAGNOSIS — N200 Calculus of kidney: Secondary | ICD-10-CM

## 2024-02-15 DIAGNOSIS — Z789 Other specified health status: Secondary | ICD-10-CM | POA: Diagnosis not present

## 2024-02-15 NOTE — Progress Notes (Signed)
 Thanks for the update.  I will keep an eye out for his echo and  labs as well.

## 2024-02-15 NOTE — Assessment & Plan Note (Signed)
 CT hematuria workup 09/30/2023: Severe left hydronephrosis with multiple large calculi in left renal calyces and left renal pelvis with abrupt caliber change of the uteropelvic junction, findings consistent with chronic UPJ obstruction, the right kidney is surgically absent. Right nephrectomy in 1998 Congenital ureteropelvic junction obstruction, preserved renal function  Managed by Dr. Adine Manly and Dr. Alvaro at [facility] urology  Patient states he has notified his urologist of the gross hematuria, now tea-colored, has plans for follow-up over the next couple weeks with their office. Per Dr. Tyrone 12/23/2023 note: He can temporarily interrupt the Brilinta  starting in January for urological procedures.

## 2024-02-15 NOTE — Assessment & Plan Note (Signed)
 CT hematuria workup 09/30/2023: Severe left hydronephrosis with multiple large calculi in left renal calyces and left renal pelvis with abrupt caliber change of the uteropelvic junction, findings consistent with chronic UPJ obstruction, the right kidney is surgically absent. Right nephrectomy in 1998 Congenital ureteropelvic junction obstruction, preserved renal function  Managed by Dr. Adine Manly and Dr. Alvaro at [facility] urology  Patient states he has notified his urologist of the gross hematuria, now tea-colored, has plans for follow-up over the next couple weeks with their office. Per Dr. Tyrone 12/23/2023 note: He can temporarily interrupt the Brilinta  starting in January for urological procedures.

## 2024-02-15 NOTE — Patient Instructions (Addendum)
 Thank you for choosing [facility]!     Medication Instructions:  No medication changes were made during today's visit.  *If you need a refill on your cardiac medications before your next appointment, please call your pharmacy*   Lab Work: BNP, BMET. CBC If you have labs (blood work) drawn today and your tests are completely normal, you will receive your results only by: MyChart Message (if you have MyChart) OR A paper copy in the mail If you have any lab test that is abnormal or we need to change your treatment, we will call you to review the results.   Testing/Procedures: Your physician has requested that you have an limited echocardiogram. Echocardiography is a painless test that uses sound waves to create images of your heart. It provides your doctor with information about the size and shape of your heart and how well your hearts chambers and valves are working. This procedure takes approximately one hour. There are no restrictions for this procedure.  Please do NOT wear cologne, aftershave, or lotions (deodorant is allowed).  Please arrive 15 minutes prior to your appointment time.   Your next appointment:   1 month(s)   Provider:   Miriam Shams, NP           Follow-Up: At [facility], you and your health needs are our priority.  As part of our continuing mission to provide you with exceptional heart care, we have created designated Provider Care Teams.  These Care Teams include your primary Cardiologist (physician) and Advanced Practice Providers (APPs -  Physician Assistants and Nurse Practitioners) who all work together to provide you with the care you need, when you need it. We recommend signing up for the patient portal called MyChart.  Sign up information is provided on this After Visit Summary.  MyChart is used to connect with patients for Virtual Visits (Telemedicine).  Patients are able to view lab/test results, encounter notes, upcoming  appointments, etc.  Non-urgent messages can be sent to your provider as well.   To learn more about what you can do with MyChart, go to [email or web address].

## 2024-02-16 ENCOUNTER — Ambulatory Visit: Payer: Self-pay | Admitting: Emergency Medicine

## 2024-02-16 LAB — BASIC METABOLIC PANEL WITH GFR
BUN/Creatinine Ratio: 13 (ref 10–24)
BUN: 13 mg/dL (ref 8–27)
CO2: 25 mmol/L (ref 20–29)
Calcium: 9.7 mg/dL (ref 8.6–10.2)
Chloride: 101 mmol/L (ref 96–106)
Creatinine, Ser: 1.01 mg/dL (ref 0.76–1.27)
Glucose: 99 mg/dL (ref 70–99)
Potassium: 4.7 mmol/L (ref 3.5–5.2)
Sodium: 138 mmol/L (ref 134–144)
eGFR: 85 mL/min/1.73

## 2024-02-16 LAB — CBC
Hematocrit: 45.4 % (ref 37.5–51.0)
Hemoglobin: 15.2 g/dL (ref 13.0–17.7)
MCH: 30.2 pg (ref 26.6–33.0)
MCHC: 33.5 g/dL (ref 31.5–35.7)
MCV: 90 fL (ref 79–97)
Platelets: 295 x10E3/uL (ref 150–450)
RBC: 5.03 x10E6/uL (ref 4.14–5.80)
RDW: 11.9 % (ref 11.6–15.4)
WBC: 9.2 x10E3/uL (ref 3.4–10.8)

## 2024-02-16 LAB — PRO B NATRIURETIC PEPTIDE: NT-Pro BNP: <36 pg/mL (ref 0–210)

## 2024-02-20 ENCOUNTER — Ambulatory Visit: Admitting: Physical Therapy

## 2024-02-21 ENCOUNTER — Telehealth: Payer: Self-pay | Admitting: Cardiovascular Disease

## 2024-02-21 ENCOUNTER — Other Ambulatory Visit: Payer: Self-pay | Admitting: Urology

## 2024-02-21 ENCOUNTER — Encounter: Payer: Self-pay | Admitting: Nurse Practitioner

## 2024-02-21 DIAGNOSIS — M2550 Pain in unspecified joint: Secondary | ICD-10-CM

## 2024-02-21 DIAGNOSIS — G8929 Other chronic pain: Secondary | ICD-10-CM

## 2024-02-21 NOTE — Telephone Encounter (Signed)
"  ° °  Pre-operative Risk Assessment    Patient Name: Donevan Biller.  DOB: [birth year] MRN: [id number]   Date of last office visit: 02/15/2024 Date of next office visit: 03/28/2024   Request for Surgical Clearance    Procedure:  Left ureteroscopy with stent placement   Date of Surgery:  Clearance 04/25/24/ 05/09/2024                                Surgeon: Dr. Alvaro Socks Group or Practice Name:  [facility]  Phone number:  [phone] Fax number:  [phone]   Type of Clearance Requested:   - Medical  - Pharmacy:  Hold Ticagrelor  (Brilinta ) TBD by Card    Type of Anesthesia:  General    Additional requests/questions:    Bonney Bernarda JONETTA Melvenia   02/21/2024, 9:51 AM   "

## 2024-02-21 NOTE — Telephone Encounter (Addendum)
 Has office visit with Miriam Shams, NP 03/28/24. Updated appt information with clearance needed.

## 2024-02-23 ENCOUNTER — Ambulatory Visit: Admitting: Pharmacist

## 2024-02-28 ENCOUNTER — Ambulatory Visit: Attending: Nurse Practitioner

## 2024-03-02 ENCOUNTER — Ambulatory Visit: Admitting: Nurse Practitioner

## 2024-03-19 ENCOUNTER — Ambulatory Visit (HOSPITAL_COMMUNITY)

## 2024-03-20 ENCOUNTER — Ambulatory Visit: Admitting: Orthopaedic Surgery

## 2024-03-28 ENCOUNTER — Ambulatory Visit: Admitting: Emergency Medicine

## 2024-04-10 ENCOUNTER — Ambulatory Visit: Admitting: Orthopaedic Surgery

## 2024-04-17 ENCOUNTER — Ambulatory Visit: Admitting: Orthopaedic Surgery

## 2024-04-25 ENCOUNTER — Ambulatory Visit (HOSPITAL_COMMUNITY): Admit: 2024-04-25 | Admitting: Urology

## 2024-04-25 SURGERY — CYSTOSCOPY/URETEROSCOPY/HOLMIUM LASER/STENT PLACEMENT
Anesthesia: General | Laterality: Left

## 2024-04-26 ENCOUNTER — Ambulatory Visit (HOSPITAL_COMMUNITY)

## 2024-05-09 ENCOUNTER — Ambulatory Visit (HOSPITAL_COMMUNITY): Admit: 2024-05-09 | Admitting: Urology

## 2024-05-09 SURGERY — CYSTOSCOPY/URETEROSCOPY/HOLMIUM LASER/STENT PLACEMENT
Anesthesia: General | Laterality: Left
